# Patient Record
Sex: Female | Born: 1985 | Race: Black or African American | Hispanic: No | Marital: Single | Smoking: Never smoker
Health system: Southern US, Community
[De-identification: ages and names within clinical notes are randomized; demographics above are authoritative.]

## PROBLEM LIST (undated history)

## (undated) ENCOUNTER — Inpatient Hospital Stay (HOSPITAL_COMMUNITY): Payer: Self-pay

## (undated) DIAGNOSIS — Z8709 Personal history of other diseases of the respiratory system: Secondary | ICD-10-CM

## (undated) DIAGNOSIS — T4145XA Adverse effect of unspecified anesthetic, initial encounter: Secondary | ICD-10-CM

## (undated) DIAGNOSIS — R7303 Prediabetes: Secondary | ICD-10-CM

## (undated) DIAGNOSIS — D259 Leiomyoma of uterus, unspecified: Secondary | ICD-10-CM

## (undated) DIAGNOSIS — J45909 Unspecified asthma, uncomplicated: Secondary | ICD-10-CM

## (undated) DIAGNOSIS — G43829 Menstrual migraine, not intractable, without status migrainosus: Secondary | ICD-10-CM

## (undated) DIAGNOSIS — R112 Nausea with vomiting, unspecified: Secondary | ICD-10-CM

## (undated) DIAGNOSIS — D573 Sickle-cell trait: Secondary | ICD-10-CM

## (undated) DIAGNOSIS — D649 Anemia, unspecified: Secondary | ICD-10-CM

## (undated) DIAGNOSIS — M1990 Unspecified osteoarthritis, unspecified site: Secondary | ICD-10-CM

## (undated) DIAGNOSIS — K5909 Other constipation: Secondary | ICD-10-CM

## (undated) DIAGNOSIS — R000 Tachycardia, unspecified: Secondary | ICD-10-CM

## (undated) DIAGNOSIS — Z789 Other specified health status: Secondary | ICD-10-CM

## (undated) DIAGNOSIS — T8859XA Other complications of anesthesia, initial encounter: Secondary | ICD-10-CM

## (undated) DIAGNOSIS — Z9889 Other specified postprocedural states: Secondary | ICD-10-CM

## (undated) HISTORY — PX: ECTOPIC PREGNANCY SURGERY: SHX613

## (undated) HISTORY — DX: Other complications of anesthesia, initial encounter: T88.59XA

## (undated) HISTORY — DX: Adverse effect of unspecified anesthetic, initial encounter: T41.45XA

## (undated) HISTORY — PX: TONSILLECTOMY: SUR1361

## (undated) HISTORY — DX: Nausea with vomiting, unspecified: R11.2

## (undated) HISTORY — DX: Other specified postprocedural states: Z98.890

## (undated) HISTORY — PX: DILATION AND CURETTAGE OF UTERUS: SHX78

---
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
## 2007-10-30 HISTORY — PX: TONSILLECTOMY: SUR1361

## 2008-06-15 ENCOUNTER — Emergency Department (HOSPITAL_COMMUNITY): Admission: EM | Admit: 2008-06-15 | Discharge: 2008-06-15 | Payer: Self-pay | Admitting: Emergency Medicine

## 2008-08-12 ENCOUNTER — Encounter (INDEPENDENT_AMBULATORY_CARE_PROVIDER_SITE_OTHER): Payer: Self-pay | Admitting: Otolaryngology

## 2008-08-12 ENCOUNTER — Ambulatory Visit (HOSPITAL_BASED_OUTPATIENT_CLINIC_OR_DEPARTMENT_OTHER): Admission: RE | Admit: 2008-08-12 | Discharge: 2008-08-12 | Payer: Self-pay | Admitting: Otolaryngology

## 2010-04-06 ENCOUNTER — Emergency Department (HOSPITAL_COMMUNITY): Admission: EM | Admit: 2010-04-06 | Discharge: 2010-04-06 | Payer: Self-pay | Admitting: Family Medicine

## 2010-08-10 ENCOUNTER — Other Ambulatory Visit: Admission: RE | Admit: 2010-08-10 | Discharge: 2010-08-10 | Payer: Self-pay | Admitting: Family Medicine

## 2010-10-29 HISTORY — PX: ECTOPIC PREGNANCY SURGERY: SHX613

## 2010-12-04 ENCOUNTER — Emergency Department (HOSPITAL_COMMUNITY)
Admission: EM | Admit: 2010-12-04 | Discharge: 2010-12-05 | Disposition: A | Discharge status: Other Acute Inpatient Hospital | Payer: PRIVATE HEALTH INSURANCE | Attending: Emergency Medicine | Admitting: Emergency Medicine

## 2010-12-04 DIAGNOSIS — O009 Unspecified ectopic pregnancy without intrauterine pregnancy: Secondary | ICD-10-CM | POA: Insufficient documentation

## 2010-12-04 DIAGNOSIS — E669 Obesity, unspecified: Secondary | ICD-10-CM | POA: Insufficient documentation

## 2010-12-04 DIAGNOSIS — J45909 Unspecified asthma, uncomplicated: Secondary | ICD-10-CM | POA: Insufficient documentation

## 2010-12-04 DIAGNOSIS — R1032 Left lower quadrant pain: Secondary | ICD-10-CM | POA: Insufficient documentation

## 2010-12-04 DIAGNOSIS — N898 Other specified noninflammatory disorders of vagina: Secondary | ICD-10-CM | POA: Insufficient documentation

## 2010-12-04 LAB — CBC
HCT: 38 % (ref 36.0–46.0)
Hemoglobin: 12.8 g/dL (ref 12.0–15.0)
MCH: 24.5 pg — ABNORMAL LOW (ref 26.0–34.0)
MCHC: 33.7 g/dL (ref 30.0–36.0)
MCV: 72.7 fL — ABNORMAL LOW (ref 78.0–100.0)
Platelets: 290 K/uL (ref 150–400)
RBC: 5.23 MIL/uL — ABNORMAL HIGH (ref 3.87–5.11)
RDW: 14.8 % (ref 11.5–15.5)
WBC: 6.9 K/uL (ref 4.0–10.5)

## 2010-12-04 LAB — DIFFERENTIAL
Basophils Absolute: 0 K/uL (ref 0.0–0.1)
Basophils Relative: 1 % (ref 0–1)
Eosinophils Absolute: 0.1 K/uL (ref 0.0–0.7)
Eosinophils Relative: 2 % (ref 0–5)
Lymphocytes Relative: 16 % (ref 12–46)
Lymphs Abs: 1.1 K/uL (ref 0.7–4.0)
Monocytes Absolute: 0.6 K/uL (ref 0.1–1.0)
Monocytes Relative: 8 % (ref 3–12)
Neutro Abs: 5 K/uL (ref 1.7–7.7)
Neutrophils Relative %: 73 % (ref 43–77)

## 2010-12-04 LAB — URINE MICROSCOPIC-ADD ON

## 2010-12-04 LAB — URINALYSIS, ROUTINE W REFLEX MICROSCOPIC
Bilirubin Urine: NEGATIVE
Ketones, ur: NEGATIVE mg/dL
Leukocytes, UA: NEGATIVE
Nitrite: NEGATIVE
Protein, ur: NEGATIVE mg/dL
Specific Gravity, Urine: 1.021 (ref 1.005–1.030)
Urine Glucose, Fasting: NEGATIVE mg/dL
Urobilinogen, UA: 0.2 mg/dL (ref 0.0–1.0)
pH: 5.5 (ref 5.0–8.0)

## 2010-12-04 LAB — POCT PREGNANCY, URINE: Preg Test, Ur: POSITIVE

## 2010-12-05 ENCOUNTER — Emergency Department (HOSPITAL_COMMUNITY): Payer: PRIVATE HEALTH INSURANCE

## 2010-12-05 ENCOUNTER — Ambulatory Visit (HOSPITAL_COMMUNITY)
Admission: AD | Admit: 2010-12-05 | Discharge: 2010-12-05 | Disposition: A | Discharge status: Home / Self Care | Payer: PRIVATE HEALTH INSURANCE | Source: Ambulatory Visit | Attending: Obstetrics and Gynecology | Admitting: Obstetrics and Gynecology

## 2010-12-05 ENCOUNTER — Other Ambulatory Visit: Payer: Self-pay | Admitting: Obstetrics and Gynecology

## 2010-12-05 DIAGNOSIS — O00109 Unspecified tubal pregnancy without intrauterine pregnancy: Secondary | ICD-10-CM | POA: Insufficient documentation

## 2010-12-05 LAB — WET PREP, GENITAL

## 2010-12-05 LAB — COMPREHENSIVE METABOLIC PANEL
Albumin: 3.6 g/dL (ref 3.5–5.2)
Alkaline Phosphatase: 55 U/L (ref 39–117)
BUN: 7 mg/dL (ref 6–23)
CO2: 27 mEq/L (ref 19–32)
Chloride: 102 mEq/L (ref 96–112)
Glucose, Bld: 131 mg/dL — ABNORMAL HIGH (ref 70–99)
Potassium: 3.8 mEq/L (ref 3.5–5.1)
Total Bilirubin: 0.2 mg/dL — ABNORMAL LOW (ref 0.3–1.2)

## 2010-12-05 LAB — LIPASE, BLOOD: Lipase: 26 U/L (ref 11–59)

## 2010-12-05 LAB — ABO/RH: ABO/RH(D): O POS

## 2010-12-05 LAB — GC/CHLAMYDIA PROBE AMP, GENITAL: GC Probe Amp, Genital: NEGATIVE

## 2010-12-05 LAB — SAMPLE TO BLOOD BANK

## 2010-12-13 NOTE — Op Note (Signed)
NAMESHAINA, Hayley Chambers                 ACCOUNT NO.:  [id number]  MEDICAL RECORD NO.:  [id number]           PATIENT TYPE:  O  LOCATION:  [facility]                          [facility]  [facility]  PHYSICIAN:  Cynthia P. Romine, M.D.DATE OF BIRTH:  Sep 03, 1986  DATE OF PROCEDURE:  12/05/2010 DATE OF DISCHARGE:                              OPERATIVE REPORT   PREOPERATIVE DIAGNOSIS:  Left ectopic pregnancy.  POSTOPERATIVE DIAGNOSIS:  Left ectopic pregnancy.  PATHOLOGY:  Pending.  PROCEDURE:  Laparoscopic partial left salpingectomy for ectopic pregnancy.  SURGEON:  Cynthia P. Romine, M.D.  ANESTHESIA:  General endotracheal.  ESTIMATED BLOOD LOSS:  Minimal.  COMPLICATIONS:  None.  PROCEDURE:  The patient was taken to the operating room and after induction of adequate general endotracheal anesthesia was placed in the dorsal lithotomy position and prepped and draped in the usual fashion. The bladder was drained with a red rubber catheter.  A Hulka uterine manipulator was placed.  Attention was next turned to the abdomen.  An infraumbilical area was anesthetized with quarter percent Marcaine and then incised with a knife, approximately 1.5 cm, and a Veress needle was inserted into peritoneal space.  Proper placement was tested by noting a negative aspirate and free flow of saline through the Veress needle, again with a negative aspirate and then by noting the response of a drop of saline placed at the hub of the Veress needle as the abdominal wall was elevated.  Pneumoperitoneum was then created with 2 liters of CO2 using the automatic insufflator.  A disposable 10/11 mm trocar was then placed into peritoneal space and its proper placement noted with the laparoscope.  The abdominal wall was transilluminated, and two areas of each lower quadrant were anesthetized with 0.25% Marcaine and 5-mm incisions were made and the 5-mm disposable trocars were placed under direct visualization.  The patient was  placed in steep Trendelenburg. The uterus was elevated, and it was felt to be normal size, shape, and contour.  On the left, the tube was freely mobile.  The fimbria was fimbriae were luxuriant.  There was a small paratubal cyst.  The ovary appeared normal that was on the right.  On the left, there was a normal ovary and mobile tube but it was dilated at its distal end along the antimesenteric border with a relatively large ectopic pregnancy that was bilobed, photographic documentation was taken, million dollar grasper was then placed to elevate the tube and the gyrus was used to come across the mesosalpinx and the ectopic pregnancy just proximal to where the ectopic was and the distal segment of the tube was excised.  There was no bleeding encountered.  There was a small amount of blood in the pelvis may be 10 mL.  A grasper teeth was put in through the operative microscope and the specimen was removed through the umbilical trocar sleeve.  It was sent to pathology.  The pelvis was inspected and irrigated.  It was hemostatic.  Photographic documentation was taken of the sleeve, and the scope were removed.  Pneumoperitoneum was allowed to escape through the umbilical trocar sleeve and then it  was removed.  The fascia was grasped with Kochers and a single suture of 0 Vicryl was placed in the fascia at the umbilicus.  The skin was closed with 4-0 Vicryl Rapide and Dermabond. The manipulator removed from the vagina, and the procedure was terminated.  The patient tolerated it well and left in satisfactory condition to post anesthesia recovery.     Cynthia P. Romine, M.D.     CPR/MEDQ  D:  12/05/2010  T:  12/06/2010  Job:  [id number]  Electronically Signed by Meredeth Ide M.D. on 12/13/2010 10:38:06 AM

## 2011-03-13 NOTE — Op Note (Signed)
Hayley Chambers, Hayley Chambers                 ACCOUNT NO.:  [id number]   MEDICAL RECORD NO.:  [id number]          PATIENT TYPE:  AMB   LOCATION:  [facility]                          FACILITY:  [facility]   PHYSICIAN:  Suzanna Obey, M.D.       DATE OF BIRTH:  [birth year]   DATE OF PROCEDURE:  08/12/2008  DATE OF DISCHARGE:                               OPERATIVE REPORT   PREOPERATIVE DIAGNOSIS:  Chronic tonsillitis.   POSTOPERATIVE DIAGNOSIS:  Chronic tonsillitis.   SURGICAL PROCEDURE:  Tonsillectomy.   ANESTHESIA:  General.   ESTIMATED BLOOD LOSS:  Less than 5 mL.   INDICATIONS:  This is a 25 year old with recurrent problems with  tonsillitis, and have been refractory to medical therapy.  The patient  was informed of the risks and benefits of the procedure, and options  were discussed.  All questions were answered and consent was obtained.   OPERATION:  The patient was taken to the operating room, placed in  supine position after adequate general endotracheal tube was placed in  the Prosed position, and draped in the usual sterile manner.  A Crowe-  Davis mouth gag was inserted, retracted, and suspended from [facility] stand.  Left tonsil, begun making a left anterior tonsillar pillar incision,  identifying the capsule of tonsil and removing it with an electrocautery  dissection. Right tonsil removed in the same fashion.  Adenoids were  checked.  There was no significant adenoid tissue present.  There was a  good hemostasis.  Hypopharynx, esophagus, and stomach were suctioned  with the NG tube.  Crowe-Davis was released and resuspended.  There was  hemostasis present in all locations.  The patient was then off the CroweEarlene Plater, awakened, and brought to the recovery room in stable condition.  Counts correct.           ______________________________  Suzanna Obey, M.D.     JB/MEDQ  D:  08/12/2008  T:  08/12/2008  Job:  [id number]

## 2012-10-29 HISTORY — PX: DILATION AND CURETTAGE OF UTERUS: SHX78

## 2013-01-13 ENCOUNTER — Emergency Department (HOSPITAL_COMMUNITY)
Admission: EM | Admit: 2013-01-13 | Discharge: 2013-01-13 | Disposition: A | Discharge status: Home / Self Care | Payer: BC Managed Care – PPO | Source: Home / Self Care | Attending: Family Medicine | Admitting: Family Medicine

## 2013-01-13 ENCOUNTER — Encounter (HOSPITAL_COMMUNITY): Payer: Self-pay

## 2013-01-13 DIAGNOSIS — J111 Influenza due to unidentified influenza virus with other respiratory manifestations: Secondary | ICD-10-CM

## 2013-01-13 MED ORDER — ACETAMINOPHEN 500 MG PO TABS
1000.0000 mg | ORAL_TABLET | Freq: Once | ORAL | Status: AC
  Administered 2013-01-13: 1000 mg via ORAL

## 2013-01-13 MED ORDER — ACETAMINOPHEN 325 MG PO TABS
ORAL_TABLET | ORAL | Status: AC
  Filled 2013-01-13: qty 3

## 2013-01-13 NOTE — ED Notes (Signed)
Felt weak, dizzy since last PM, sister w pt states she nearly fainted whole heating soup and had to sit down

## 2013-01-13 NOTE — ED Provider Notes (Signed)
History     CSN: [id number]  Arrival date & time 01/13/13  1552   First MD Initiated Contact with Patient 01/13/13 1603      Chief Complaint  Patient presents with  . Influenza    (Consider location/radiation/quality/duration/timing/severity/associated sxs/prior treatment) Patient is a 27 y.o. female presenting with flu symptoms. The history is provided by the patient and a relative.  Influenza Presenting symptoms: fever, myalgias and sore throat   Presenting symptoms: no nausea, no rhinorrhea, no shortness of breath and no vomiting   Severity:  Moderate Onset quality:  Sudden Duration:  1 day Progression:  Worsening Chronicity:  New Relieved by:  Nothing Associated symptoms: chills   Associated symptoms: no congestion   Risk factors comment:  Took flu vaccine in oct 2013.   History reviewed. No pertinent past medical history.  Past Surgical History  Procedure Laterality Date  . Tonsillectomy    . Ectopic pregnancy surgery      History reviewed. No pertinent family history.  History  Substance Use Topics  . Smoking status: Never Smoker   . Smokeless tobacco: Not on file  . Alcohol Use: Yes    OB History   Grav Para Term Preterm Abortions TAB SAB Ect Mult Living                  Review of Systems  Constitutional: Positive for fever and chills.  HENT: Positive for sore throat. Negative for congestion and rhinorrhea.   Respiratory: Negative for shortness of breath.   Gastrointestinal: Negative for nausea, vomiting and abdominal pain.  Musculoskeletal: Positive for myalgias. Negative for back pain.  Skin: Negative.     Allergies  Review of patient's allergies indicates no known allergies.  Home Medications  No current outpatient prescriptions on file.  BP 106/62  Pulse 114  Temp(Src) 102.5 F (39.2 C) (Oral)  Resp 24  SpO2 100%  LMP 01/03/2013  Physical Exam  Nursing note and vitals reviewed. Constitutional: She is oriented to person, place,  and time. She appears well-developed and well-nourished. No distress.  HENT:  Head: Normocephalic.  Right Ear: External ear normal.  Left Ear: External ear normal.  Mouth/Throat: Oropharynx is clear and moist.  Eyes: Pupils are equal, round, and reactive to light.  Neck: Normal range of motion. Neck supple.  Cardiovascular: Normal rate, regular rhythm, normal heart sounds and intact distal pulses.   Pulmonary/Chest: Effort normal and breath sounds normal.  Abdominal: Soft. Bowel sounds are normal. There is no tenderness.  Lymphadenopathy:    She has no cervical adenopathy.  Neurological: She is alert and oriented to person, place, and time.  Skin: Skin is warm and dry.    ED Course  Procedures (including critical care time)  Labs Reviewed - No data to display No results found.   1. Influenza       MDM          Linna Hoff, MD 01/13/13 [phone]

## 2013-04-28 ENCOUNTER — Other Ambulatory Visit (HOSPITAL_COMMUNITY)
Admission: RE | Admit: 2013-04-28 | Discharge: 2013-04-28 | Disposition: A | Discharge status: Home / Self Care | Payer: BC Managed Care – PPO | Source: Ambulatory Visit | Attending: Obstetrics and Gynecology | Admitting: Obstetrics and Gynecology

## 2013-04-28 ENCOUNTER — Other Ambulatory Visit: Payer: Self-pay | Admitting: Nurse Practitioner

## 2013-04-28 DIAGNOSIS — Z01419 Encounter for gynecological examination (general) (routine) without abnormal findings: Secondary | ICD-10-CM | POA: Insufficient documentation

## 2013-08-05 ENCOUNTER — Encounter (HOSPITAL_COMMUNITY): Payer: Self-pay | Admitting: Emergency Medicine

## 2013-08-05 ENCOUNTER — Emergency Department (INDEPENDENT_AMBULATORY_CARE_PROVIDER_SITE_OTHER)
Admission: EM | Admit: 2013-08-05 | Discharge: 2013-08-05 | Disposition: A | Discharge status: Home / Self Care | Payer: BC Managed Care – PPO | Source: Home / Self Care | Attending: Family Medicine | Admitting: Family Medicine

## 2013-08-05 DIAGNOSIS — M25569 Pain in unspecified knee: Secondary | ICD-10-CM

## 2013-08-05 DIAGNOSIS — M25562 Pain in left knee: Secondary | ICD-10-CM

## 2013-08-05 DIAGNOSIS — M545 Low back pain: Secondary | ICD-10-CM

## 2013-08-05 MED ORDER — MELOXICAM 15 MG PO TABS: 15.0000 mg | ORAL_TABLET | Freq: Every day | ORAL | Status: DC | PRN
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
## 2014-01-05 ENCOUNTER — Other Ambulatory Visit (HOSPITAL_COMMUNITY)
Admission: RE | Admit: 2014-01-05 | Discharge: 2014-01-05 | Disposition: A | Discharge status: Home / Self Care | Payer: 59 | Source: Ambulatory Visit | Attending: Family Medicine | Admitting: Family Medicine

## 2014-01-05 ENCOUNTER — Emergency Department (HOSPITAL_COMMUNITY)
Admission: EM | Admit: 2014-01-05 | Discharge: 2014-01-05 | Disposition: A | Discharge status: Home / Self Care | Payer: 59 | Source: Home / Self Care | Attending: Family Medicine | Admitting: Family Medicine

## 2014-01-05 DIAGNOSIS — N760 Acute vaginitis: Secondary | ICD-10-CM

## 2014-01-05 DIAGNOSIS — N390 Urinary tract infection, site not specified: Secondary | ICD-10-CM

## 2014-01-05 DIAGNOSIS — Z113 Encounter for screening for infections with a predominantly sexual mode of transmission: Secondary | ICD-10-CM | POA: Insufficient documentation

## 2014-01-05 LAB — POCT PREGNANCY, URINE: PREG TEST UR: NEGATIVE

## 2014-01-05 LAB — POCT URINALYSIS DIP (DEVICE)
BILIRUBIN URINE: NEGATIVE
Glucose, UA: NEGATIVE mg/dL
KETONES UR: NEGATIVE mg/dL
Nitrite: NEGATIVE
PH: 6 (ref 5.0–8.0)
PROTEIN: 30 mg/dL — AB
SPECIFIC GRAVITY, URINE: 1.02 (ref 1.005–1.030)
Urobilinogen, UA: 0.2 mg/dL (ref 0.0–1.0)

## 2014-01-05 MED ORDER — CEFUROXIME AXETIL 250 MG PO TABS: 250.0000 mg | ORAL_TABLET | Freq: Two times a day (BID) | ORAL | Status: DC

## 2014-01-05 MED ORDER — CEFIXIME 400 MG PO TABS
400.0000 mg | ORAL_TABLET | Freq: Once | ORAL | Status: AC
  Administered 2014-01-05: 400 mg via ORAL

## 2014-01-05 MED ORDER — CEFIXIME 400 MG PO TABS
ORAL_TABLET | ORAL | Status: AC
  Filled 2014-01-05: qty 1

## 2014-01-05 MED ORDER — FLUCONAZOLE 150 MG PO TABS: 150.0000 mg | ORAL_TABLET | Freq: Once | ORAL | Status: DC

## 2014-01-05 NOTE — ED Provider Notes (Signed)
CSN: [id number]     Arrival date & time 01/05/14  1841 History   First MD Initiated Contact with Patient 01/05/14 1928     Chief Complaint  Patient presents with  . Urinary Tract Infection  . Vaginal Discharge   (Consider location/radiation/quality/duration/timing/severity/associated sxs/prior Treatment) HPI Comments: 28 year old female presents complaining of possible urinary tract infection. Starting a few hours ago, she began to have lower abdominal pain and pressure as well as burning pain in her urethra after urination. She also has urinary frequency and urgency. She has had a urinary tract infection before that felt very similar to this at the beginning. About a week ago she had some vaginal discharge that was malodorous, she would like to have this evaluated as well. Denies fever, chills, NVD, flank pain. Denies any risk factors for STDs  Patient is a 28 y.o. female presenting with urinary tract infection and vaginal discharge.  Urinary Tract Infection Pertinent negatives include no chest pain, no abdominal pain and no shortness of breath.  Vaginal Discharge Associated symptoms: dysuria   Associated symptoms: no abdominal pain, no fever, no nausea and no vomiting     No past medical history on file. Past Surgical History  Procedure Laterality Date  . Tonsillectomy    . Ectopic pregnancy surgery     No family history on file. History  Substance Use Topics  . Smoking status: Never Smoker   . Smokeless tobacco: Not on file  . Alcohol Use: Yes   OB History   Grav Para Term Preterm Abortions TAB SAB Ect Mult Living                 Review of Systems  Constitutional: Negative for fever and chills.  Eyes: Negative for visual disturbance.  Respiratory: Negative for cough and shortness of breath.   Cardiovascular: Negative for chest pain, palpitations and leg swelling.  Gastrointestinal: Negative for nausea, vomiting and abdominal pain.  Endocrine: Negative for polydipsia and  polyuria.  Genitourinary: Positive for dysuria, urgency, frequency and vaginal discharge. Negative for hematuria, decreased urine volume, vaginal bleeding, genital sores, vaginal pain and pelvic pain.  Musculoskeletal: Negative for arthralgias and myalgias.  Skin: Negative for rash.  Neurological: Negative for dizziness, weakness and light-headedness.    Allergies  Review of patient's allergies indicates no known allergies.  Home Medications   Current Outpatient Rx  Name  Route  Sig  Dispense  Refill  . cefUROXime (CEFTIN) 250 MG tablet   Oral   Take 1 tablet (250 mg total) by mouth 2 (two) times daily with a meal.   10 tablet   0   . fluconazole (DIFLUCAN) 150 MG tablet   Oral   Take 1 tablet (150 mg total) by mouth once. Pick up the refill and and take second dose in 5 days if symptoms have not resolved   1 tablet   1   . meloxicam (MOBIC) 15 MG tablet   Oral   Take 1 tablet (15 mg total) by mouth daily as needed for pain.   30 tablet   0    LMP 12/16/2013 Physical Exam  Nursing note and vitals reviewed. Constitutional: She is oriented to person, place, and time. Vital signs are normal. She appears well-developed and well-nourished. No distress.  HENT:  Head: Normocephalic and atraumatic.  Pulmonary/Chest: Effort normal. No respiratory distress.  Abdominal: Soft. Bowel sounds are normal. She exhibits no distension and no mass. There is tenderness (suprapubic). There is no rebound and no guarding.  Genitourinary: Uterus normal. There is no lesion on the right labia. Cervix exhibits no discharge and no friability. No erythema, tenderness or bleeding around the vagina. Vaginal discharge (Minimal) found.  Lymphadenopathy:       Right: No inguinal adenopathy present.       Left: No inguinal adenopathy present.  Neurological: She is alert and oriented to person, place, and time. She has normal strength. Coordination normal.  Skin: Skin is warm and dry. No rash noted. She is  not diaphoretic.  Psychiatric: She has a normal mood and affect. Judgment normal.    ED Course  Procedures (including critical care time) Labs Review Labs Reviewed  POCT URINALYSIS DIP (DEVICE) - Abnormal; Notable for the following:    Hgb urine dipstick MODERATE (*)    Protein, ur 30 (*)    Leukocytes, UA LARGE (*)    All other components within normal limits  URINE CULTURE  POCT PREGNANCY, URINE  CERVICOVAGINAL ANCILLARY ONLY   Imaging Review No results found.   MDM   1. UTI (lower urinary tract infection)   2. Vaginitis    Giving a dose of Suprax at night so she can go ahead and start antibiotics and she will start Ceftin in the morning. She says she always gets a yeast infection when she takes antibiotics, will prescribe Diflucan as well. Followup when necessary if not improving   Meds ordered this encounter  Medications  . cefixime (SUPRAX) tablet 400 mg    Sig:   . cefUROXime (CEFTIN) 250 MG tablet    Sig: Take 1 tablet (250 mg total) by mouth 2 (two) times daily with a meal.    Dispense:  10 tablet    Refill:  0    Order Specific Question:  Supervising Provider    Answer:  Lynne Leader, [place]  . fluconazole (DIFLUCAN) 150 MG tablet    Sig: Take 1 tablet (150 mg total) by mouth once. Pick up the refill and and take second dose in 5 days if symptoms have not resolved    Dispense:  1 tablet    Refill:  1    Order Specific Question:  Supervising Provider    Answer:  Lynne Leader, [place]       Liam Graham, PA-C 01/05/14 2000

## 2014-01-05 NOTE — Discharge Instructions (Signed)
Urinary Tract Infection  Urinary tract infections (UTIs) can develop anywhere along your urinary tract. Your urinary tract is your body's drainage system for removing wastes and extra water. Your urinary tract includes two kidneys, two ureters, a bladder, and a urethra. Your kidneys are a pair of bean-shaped organs. Each kidney is about the size of your fist. They are located below your ribs, one on each side of your spine.  CAUSES  Infections are caused by microbes, which are microscopic organisms, including fungi, viruses, and bacteria. These organisms are so small that they can only be seen through a microscope. Bacteria are the microbes that most commonly cause UTIs.  SYMPTOMS   Symptoms of UTIs may vary by age and gender of the patient and by the location of the infection. Symptoms in young women typically include a frequent and intense urge to urinate and a painful, burning feeling in the bladder or urethra during urination. Older women and men are more likely to be tired, shaky, and weak and have muscle aches and abdominal pain. A fever may mean the infection is in your kidneys. Other symptoms of a kidney infection include pain in your back or sides below the ribs, nausea, and vomiting.  DIAGNOSIS  To diagnose a UTI, your caregiver will ask you about your symptoms. Your caregiver also will ask to provide a urine sample. The urine sample will be tested for bacteria and white blood cells. White blood cells are made by your body to help fight infection.  TREATMENT   Typically, UTIs can be treated with medication. Because most UTIs are caused by a bacterial infection, they usually can be treated with the use of antibiotics. The choice of antibiotic and length of treatment depend on your symptoms and the type of bacteria causing your infection.  HOME CARE INSTRUCTIONS   If you were prescribed antibiotics, take them exactly as your caregiver instructs you. Finish the medication even if you feel better after you  have only taken some of the medication.   Drink enough water and fluids to keep your urine clear or pale yellow.   Avoid caffeine, tea, and carbonated beverages. They tend to irritate your bladder.   Empty your bladder often. Avoid holding urine for long periods of time.   Empty your bladder before and after sexual intercourse.   After a bowel movement, women should cleanse from front to back. Use each tissue only once.  SEEK MEDICAL CARE IF:    You have back pain.   You develop a fever.   Your symptoms do not begin to resolve within 3 days.  SEEK IMMEDIATE MEDICAL CARE IF:    You have severe back pain or lower abdominal pain.   You develop chills.   You have nausea or vomiting.   You have continued burning or discomfort with urination.  MAKE SURE YOU:    Understand these instructions.   Will watch your condition.   Will get help right away if you are not doing well or get worse.  Document Released: 07/25/2005 Document Revised: 04/15/2012 Document Reviewed: 11/23/2011  [employer] Patient Information 2014 [employer]

## 2014-01-05 NOTE — ED Notes (Signed)
C/o  Urinary urgency and frequency.  Pain after urinating.  Lower pelvic pain.  On set today.   Also c/o  Vaginal discharge with odor.    No otc meds taken for symptoms.

## 2014-01-06 LAB — CERVICOVAGINAL ANCILLARY ONLY
Chlamydia: NEGATIVE
Neisseria Gonorrhea: NEGATIVE
WET PREP (BD AFFIRM): NEGATIVE
WET PREP (BD AFFIRM): NEGATIVE
Wet Prep (BD Affirm): NEGATIVE

## 2014-01-06 NOTE — ED Provider Notes (Signed)
Medical screening examination/treatment/procedure(s) were performed by a resident physician or non-physician practitioner and as the supervising physician I was immediately available for consultation/collaboration.  Natividad Schlosser, MD    Child Campoy S Aviana Shevlin, MD 01/06/14 0803 

## 2014-01-07 LAB — URINE CULTURE

## 2014-01-07 NOTE — ED Notes (Addendum)
GC/Chlamydia neg., Affirm: Candida, Gardnerella and Trich neg., Urine culture:: >100,000 colonies E. Coli.  Pt. adequately treated with Ceftin. Reviewed by Dr. Georgina Snell. Roselyn Meier 01/07/2014

## 2014-02-20 ENCOUNTER — Emergency Department: Payer: Self-pay | Admitting: Internal Medicine

## 2015-03-28 ENCOUNTER — Emergency Department (HOSPITAL_COMMUNITY)
Admission: EM | Admit: 2015-03-28 | Discharge: 2015-03-28 | Disposition: A | Discharge status: Home / Self Care | Payer: PRIVATE HEALTH INSURANCE | Attending: Emergency Medicine | Admitting: Emergency Medicine

## 2015-03-28 ENCOUNTER — Emergency Department (HOSPITAL_COMMUNITY): Payer: PRIVATE HEALTH INSURANCE

## 2015-03-28 ENCOUNTER — Encounter (HOSPITAL_COMMUNITY): Payer: Self-pay | Admitting: Emergency Medicine

## 2015-03-28 DIAGNOSIS — Z79899 Other long term (current) drug therapy: Secondary | ICD-10-CM | POA: Diagnosis not present

## 2015-03-28 DIAGNOSIS — R079 Chest pain, unspecified: Secondary | ICD-10-CM | POA: Diagnosis present

## 2015-03-28 DIAGNOSIS — R0789 Other chest pain: Secondary | ICD-10-CM | POA: Diagnosis not present

## 2015-03-28 LAB — BASIC METABOLIC PANEL
ANION GAP: 7 (ref 5–15)
BUN: 10 mg/dL (ref 6–20)
CO2: 27 mmol/L (ref 22–32)
Calcium: 9.3 mg/dL (ref 8.9–10.3)
Chloride: 106 mmol/L (ref 101–111)
Creatinine, Ser: 0.76 mg/dL (ref 0.44–1.00)
GFR calc Af Amer: >60 mL/min (ref >60)
GFR calc non Af Amer: >60 mL/min (ref >60)
GLUCOSE: 75 mg/dL (ref 65–99)
Potassium: 3.6 mmol/L (ref 3.5–5.1)
Sodium: 140 mmol/L (ref 135–145)

## 2015-03-28 LAB — CBC
HCT: 42.3 % (ref 36.0–46.0)
Hemoglobin: 13.9 g/dL (ref 12.0–15.0)
MCH: 23.9 pg — ABNORMAL LOW (ref 26.0–34.0)
MCHC: 32.9 g/dL (ref 30.0–36.0)
MCV: 72.8 fL — ABNORMAL LOW (ref 78.0–100.0)
PLATELETS: 293 10*3/uL (ref 150–400)
RBC: 5.81 MIL/uL — ABNORMAL HIGH (ref 3.87–5.11)
RDW: 14.4 % (ref 11.5–15.5)
WBC: 4.8 10*3/uL (ref 4.0–10.5)

## 2015-03-28 LAB — I-STAT TROPONIN, ED: Troponin i, poc: 0 ng/mL (ref 0.00–0.08)

## 2015-03-28 MED ORDER — NAPROXEN 500 MG PO TABS
500.0000 mg | ORAL_TABLET | Freq: Once | ORAL | Status: AC
  Administered 2015-03-28: 500 mg via ORAL
  Filled 2015-03-28: qty 1

## 2015-03-28 MED ORDER — KETOROLAC TROMETHAMINE 60 MG/2ML IM SOLN
30.0000 mg | Freq: Once | INTRAMUSCULAR | Status: AC
  Administered 2015-03-28: 30 mg via INTRAMUSCULAR
  Filled 2015-03-28: qty 2

## 2015-03-28 MED ORDER — NAPROXEN 250 MG PO TABS: 250.0000 mg | ORAL_TABLET | Freq: Two times a day (BID) | ORAL | Status: DC

## 2015-03-28 NOTE — ED Notes (Signed)
Pt reports left chest pain (underneath left breast) radiating to the back starting yesterday. Says, "I thought it was gas so I took Tums but it didn't go away. I took 2 Tums today." Denies SOB/N/V/D/F/dizziness/lightheadedness. Denies sickness recently. Denies any injury/trauma to ribs recently. Was recently on NuvaRing-stopped last month. Denies any other symptoms/changes.

## 2015-03-28 NOTE — ED Notes (Signed)
Pt is eating and drinking chik fil a that boyfriend brought to hospital for her

## 2015-03-28 NOTE — ED Provider Notes (Signed)
CSN: [id number]     Arrival date & time 03/28/15  1338 History   First MD Initiated Contact with Patient 03/28/15 1522     Chief Complaint  Patient presents with  . Chest Pain   Hayley Chambers is a 29 y.o. female who is otherwise healthy who presents to the ED complaining of gradual onset of left sided chest pain starting last night. She reports it has gradually worsened since waking up this morning. She is unable to elicit in aggravating or alleviating factors. She complains of 6 out of 10 left-sided chest pain that is somewhat back and she describes as aching and sharp. She has worsening pain with breathing or palpitations. She denies shortness of breath or cough. She denies any injury or trauma to her chest. She denies any more exercise. Reports taking Tums for her symptoms relief. She denies any acid reflux, burping or belching. She denies smoking, recent surgery or recent long travel. She reports he stopped using her NuvaRing one month ago. She denies any personal or close family history of MI, PE or DVT. She reports a grandfather with a history of a MI and a grandmother with a history of a DVT. She denies any personal or family history of blood clotting disorders such as factor V leiden, protein C or S deficiency. Patient denies fevers, chills, abdominal pain, nausea, vomiting, palpitations, shortness breath, coughing, hemoptysis, wheezing, belching, or urinary symptoms.  (Consider location/radiation/quality/duration/timing/severity/associated sxs/prior Treatment) HPI  History reviewed. No pertinent past medical history. Past Surgical History  Procedure Laterality Date  . Tonsillectomy    . Ectopic pregnancy surgery     History reviewed. No pertinent family history. History  Substance Use Topics  . Smoking status: Never Smoker   . Smokeless tobacco: Not on file  . Alcohol Use: Yes   OB History    No data available     Review of Systems  Constitutional: Negative for fever and  chills.  HENT: Negative for congestion, ear pain and sore throat.   Eyes: Negative for pain and visual disturbance.  Respiratory: Negative for cough, chest tightness, shortness of breath and wheezing.   Cardiovascular: Positive for chest pain. Negative for palpitations and leg swelling.  Gastrointestinal: Negative for nausea, vomiting, abdominal pain and diarrhea.  Genitourinary: Negative for dysuria, urgency, frequency and difficulty urinating.  Musculoskeletal: Negative for back pain and neck pain.  Skin: Negative for rash.  Neurological: Negative for dizziness, light-headedness and headaches.      Allergies  Review of patient's allergies indicates no known allergies.  Home Medications   Prior to Admission medications   Medication Sig Start Date End Date Taking? Authorizing Provider  ibuprofen (ADVIL,MOTRIN) 200 MG tablet Take 600 mg by mouth every 6 (six) hours as needed for headache.   Yes Historical Provider, MD  cefUROXime (CEFTIN) 250 MG tablet Take 1 tablet (250 mg total) by mouth 2 (two) times daily with a meal. Patient not taking: Reported on 03/28/2015 01/05/14   Liam Graham, PA-C  fluconazole (DIFLUCAN) 150 MG tablet Take 1 tablet (150 mg total) by mouth once. Pick up the refill and and take second dose in 5 days if symptoms have not resolved Patient not taking: Reported on 03/28/2015 01/05/14   Liam Graham, PA-C  meloxicam (MOBIC) 15 MG tablet Take 1 tablet (15 mg total) by mouth daily as needed for pain. Patient not taking: Reported on 03/28/2015 08/05/13   Gregor Hams, MD  naproxen (NAPROSYN) 250 MG tablet Take 1  tablet (250 mg total) by mouth 2 (two) times daily with a meal. 03/28/15   Waynetta Pean, PA-C   BP 112/72 mmHg  Pulse 88  Temp(Src) 97.9 F (36.6 C) (Oral)  Resp 18  Ht 5' (1.524 m)  Wt 200 lb (90.719 kg)  BMI 39.06 kg/m2  SpO2 99%  LMP 03/03/2015 (Approximate) Physical Exam  Constitutional: She is oriented to person, place, and time. She appears  well-developed and well-nourished. No distress.  Nontoxic appearing.  HENT:  Head: Normocephalic and atraumatic.  Right Ear: External ear normal.  Left Ear: External ear normal.  Mouth/Throat: Oropharynx is clear and moist. No oropharyngeal exudate.  Eyes: Conjunctivae are normal. Pupils are equal, round, and reactive to light. Right eye exhibits no discharge. Left eye exhibits no discharge.  Neck: Normal range of motion. Neck supple. No JVD present. No tracheal deviation present.  Cardiovascular: Normal rate, regular rhythm, normal heart sounds and intact distal pulses.  Exam reveals no gallop and no friction rub.   No murmur heard. Bilateral radial, posterior tibialis and dorsalis pedis pulses are intact.  HR 80.  Pulmonary/Chest: Effort normal and breath sounds normal. No respiratory distress. She has no wheezes. She has no rales. She exhibits tenderness.  Left sided chest is tender to palpation and reproduces her chest pain. No chest deformity, or rashes.   Abdominal: Soft. Bowel sounds are normal. She exhibits no distension. There is no tenderness. There is no guarding.  Musculoskeletal: She exhibits no edema or tenderness.  No lower extremity edema or tenderness.  Lymphadenopathy:    She has no cervical adenopathy.  Neurological: She is alert and oriented to person, place, and time. Coordination normal.  Skin: Skin is warm and dry. No rash noted. She is not diaphoretic. No erythema. No pallor.  Psychiatric: She has a normal mood and affect. Her behavior is normal.  Nursing note and vitals reviewed.   ED Course  Procedures (including critical care time) Labs Review Labs Reviewed  CBC - Abnormal; Notable for the following:    RBC 5.81 (*)    MCV 72.8 (*)    MCH 23.9 (*)    All other components within normal limits  BASIC METABOLIC PANEL  I-STAT TROPOININ, ED    Imaging Review Dg Chest 2 View  03/28/2015   CLINICAL DATA:  Left-sided chest pain for 1 day.  EXAM: CHEST  2  VIEW  COMPARISON:  None.  FINDINGS: Heart size and mediastinal contours are within normal limits. Both lungs are clear. Visualized skeletal structures are unremarkable.  IMPRESSION: Normal examination.   Electronically Signed   By: Inge Rise M.D.   On: 03/28/2015 14:18     EKG Interpretation   Date/Time:  Monday Mar 28 2015 13:46:02 EDT Ventricular Rate:  98 PR Interval:  142 QRS Duration: 74 QT Interval:  325 QTC Calculation: 415 R Axis:   63 Text Interpretation:  Sinus rhythm Confirmed by Alvino Chapel  MD, NATHAN  [phone]) on 03/28/2015 4:42:39 PM      Filed Vitals:   03/28/15 1343 03/28/15 1540 03/28/15 1544 03/28/15 1720  BP: 110/71  104/71 112/72  Pulse: 108 70  88  Temp: 98.7 F (37.1 C) 97.9 F (36.6 C)    TempSrc: Oral Oral    Resp: 18   18  Height:  5' (1.524 m)    Weight:  200 lb (90.719 kg)    SpO2: 100% 99%  99%     MDM   Meds given in ED:  Medications  ketorolac (TORADOL) injection 30 mg (30 mg Intramuscular Given 03/28/15 1659)    New Prescriptions   NAPROXEN (NAPROSYN) 250 MG TABLET    Take 1 tablet (250 mg total) by mouth 2 (two) times daily with a meal.    Final diagnoses:  Chest pain, unspecified chest pain type   This is a 29 year old female presents the emergency department complaining of gradually worsening left-sided chest pain since yesterday. On exam the patient is afebrile and nontoxic appearing. The patient's chest pain is reproducible on palpation of her left chest. Patient is to be discharged with recommendation to follow up with PCP in regards to today's hospital visit. Chest pain is not likely of cardiac or pulmonary etiology d/t presentation, VSS, no tracheal deviation, no JVD or new murmur, RRR, breath sounds equal bilaterally, EKG without acute abnormalities, negative troponin, and negative CXR. Patient without any risk factors for PE today, her initial heart rate was 107, but has remained under 100 since. She was not tachycardic during  my exam. With her pain reproducible with palpation I doubt pulmonary embolism. Pt has been advised to return to the ED if CP becomes exertional, associated with diaphoresis or nausea, radiates to left jaw/arm, worsens or becomes concerning in any way. Pt appears reliable for follow up and is agreeable to discharge. Patient given toradol injection prior to discharge for pain. I advised the patient to follow-up with their primary care provider this week. I advised the patient to return to the emergency department with new or worsening symptoms or new concerns. The patient verbalized understanding and agreement with plan.    This patient was discussed with Dr. Alvino Chapel who agrees with assessment and plan.     Waynetta Pean, PA-C 03/28/15 1733  Davonna Belling, MD 03/29/15 [phone]

## 2015-03-28 NOTE — ED Notes (Signed)
Urine collected and in bin located in triage

## 2015-03-28 NOTE — Progress Notes (Signed)
Pt states her pcp is [facility]  States she has not made her first appt with them yet  EPIC updated

## 2015-03-28 NOTE — Discharge Instructions (Signed)
Chest Pain (Nonspecific) °It is often hard to give a specific diagnosis for the cause of chest pain. There is always a chance that your pain could be related to something serious, such as a heart attack or a blood clot in the lungs. You need to follow up with your health care provider for further evaluation. °CAUSES  °· Heartburn. °· Pneumonia or bronchitis. °· Anxiety or stress. °· Inflammation around your heart (pericarditis) or lung (pleuritis or pleurisy). °· A blood clot in the lung. °· A collapsed lung (pneumothorax). It can develop suddenly on its own (spontaneous pneumothorax) or from trauma to the chest. °· Shingles infection (herpes zoster virus). °The chest wall is composed of bones, muscles, and cartilage. Any of these can be the source of the pain. °· The bones can be bruised by injury. °· The muscles or cartilage can be strained by coughing or overwork. °· The cartilage can be affected by inflammation and become sore (costochondritis). °DIAGNOSIS  °Lab tests or other studies may be needed to find the cause of your pain. Your health care provider may have you take a test called an ambulatory electrocardiogram (ECG). An ECG records your heartbeat patterns over a 24-hour period. You may also have other tests, such as: °· Transthoracic echocardiogram (TTE). During echocardiography, sound waves are used to evaluate how blood flows through your heart. °· Transesophageal echocardiogram (TEE). °· Cardiac monitoring. This allows your health care provider to monitor your heart rate and rhythm in real time. °· Holter monitor. This is a portable device that records your heartbeat and can help diagnose heart arrhythmias. It allows your health care provider to track your heart activity for several days, if needed. °· Stress tests by exercise or by giving medicine that makes the heart beat faster. °TREATMENT  °· Treatment depends on what may be causing your chest pain. Treatment may include: °· Acid blockers for  heartburn. °· Anti-inflammatory medicine. °· Pain medicine for inflammatory conditions. °· Antibiotics if an infection is present. °· You may be advised to change lifestyle habits. This includes stopping smoking and avoiding alcohol, caffeine, and chocolate. °· You may be advised to keep your head raised (elevated) when sleeping. This reduces the chance of acid going backward from your stomach into your esophagus. °Most of the time, nonspecific chest pain will improve within 2-3 days with rest and mild pain medicine.  °HOME CARE INSTRUCTIONS  °· If antibiotics were prescribed, take them as directed. Finish them even if you start to feel better. °· For the next few days, avoid physical activities that bring on chest pain. Continue physical activities as directed. °· Do not use any tobacco products, including cigarettes, chewing tobacco, or electronic cigarettes. °· Avoid drinking alcohol. °· Only take medicine as directed by your health care provider. °· Follow your health care provider's suggestions for further testing if your chest pain does not go away. °· Keep any follow-up appointments you made. If you do not go to an appointment, you could develop lasting (chronic) problems with pain. If there is any problem keeping an appointment, call to reschedule. °SEEK MEDICAL CARE IF:  °· Your chest pain does not go away, even after treatment. °· You have a rash with blisters on your chest. °· You have a fever. °SEEK IMMEDIATE MEDICAL CARE IF:  °· You have increased chest pain or pain that spreads to your arm, neck, jaw, back, or abdomen. °· You have shortness of breath. °· You have an increasing cough, or you cough   up blood.  You have severe back or abdominal pain.  You feel nauseous or vomit.  You have severe weakness.  You faint.  You have chills. This is an emergency. Do not wait to see if the pain will go away. Get medical help at once. Call your local emergency services (911 in [place] Do not drive  yourself to the hospital. MAKE SURE YOU:   Understand these instructions.  Will watch your condition.  Will get help right away if you are not doing well or get worse. Document Released: 07/25/2005 Document Revised: 10/20/2013 Document Reviewed: 05/20/2008 [facility] Patient Information 2015 [place], [place]. This information is not intended to replace advice given to you by your health care provider. Make sure you discuss any questions you have with your health care provider. Costochondritis Costochondritis, sometimes called Tietze syndrome, is a swelling and irritation (inflammation) of the tissue (cartilage) that connects your ribs with your breastbone (sternum). It causes pain in the chest and rib area. Costochondritis usually goes away on its own over time. It can take up to 6 weeks or longer to get better, especially if you are unable to limit your activities. CAUSES  Some cases of costochondritis have no known cause. Possible causes include:  Injury (trauma).  Exercise or activity such as lifting.  Severe coughing. SIGNS AND SYMPTOMS  Pain and tenderness in the chest and rib area.  Pain that gets worse when coughing or taking deep breaths.  Pain that gets worse with specific movements. DIAGNOSIS  Your health care provider will do a physical exam and ask about your symptoms. Chest X-rays or other tests may be done to rule out other problems. TREATMENT  Costochondritis usually goes away on its own over time. Your health care provider may prescribe medicine to help relieve pain. HOME CARE INSTRUCTIONS   Avoid exhausting physical activity. Try not to strain your ribs during normal activity. This would include any activities using chest, abdominal, and side muscles, especially if heavy weights are used.  Apply ice to the affected area for the first 2 days after the pain begins.  Put ice in a plastic bag.  Place a towel between your skin and the bag.  Leave the ice on for 20  minutes, 2-3 times a day.  Only take over-the-counter or prescription medicines as directed by your health care provider. SEEK MEDICAL CARE IF:  You have redness or swelling at the rib joints. These are signs of infection.  Your pain does not go away despite rest or medicine. SEEK IMMEDIATE MEDICAL CARE IF:   Your pain increases or you are very uncomfortable.  You have shortness of breath or difficulty breathing.  You cough up blood.  You have worse chest pains, sweating, or vomiting.  You have a fever or persistent symptoms for more than 2-3 days.  You have a fever and your symptoms suddenly get worse. MAKE SURE YOU:   Understand these instructions.  Will watch your condition.  Will get help right away if you are not doing well or get worse. Document Released: 07/25/2005 Document Revised: 08/05/2013 Document Reviewed: 05/19/2013 [facility] Patient Information 2015 [place], [place]. This information is not intended to replace advice given to you by your health care provider. Make sure you discuss any questions you have with your health care provider.

## 2015-10-30 NOTE — L&D Delivery Note (Signed)
Delivery Note IOL - elective 10/11/16 - 2300 -  Cervical ripening with misoprostol 50 mcg PO q 4 hrs x 4 doses 10/12/16 - 1100 - cervical balloon 10/12/16 - 2100 - Pitocin augmentation 10/13/16 - 0100 - cervical balloon out 10/13/16 - 0205 SROM, clear fluid, onset of active labor 10/13/16 - 0830 - c/c/+2, onset of active 2nd stage  FHT category I and II 1st stage of labor          Category II 2nd stage of labor Pushing with every other ctx due to deep variables with slow return to baseline, pushed in L lateral position, lots of encouragement from family and staff.  At 9:02 AM a viable female was delivered via Vaginal, Spontaneous Delivery (Presentation: OA to LOT).  APGAR: 8 , 9 ; weight  pending.   Placenta status: delivered schultz, C/I/S.  Cord:  3V with the following complications: none.    Anesthesia:  epidural Episiotomy:  none Lacerations:  1st degree vaginal floor Suture Repair: 3.0 vicryl Est. Blood Loss (mL):  200  Mom to postpartum.  Baby to Couplet care / Skin to Skin.  Juliene Pina, CNM 10/13/2016, 9:36 AM

## 2015-12-22 ENCOUNTER — Other Ambulatory Visit: Payer: Self-pay | Admitting: Nurse Practitioner

## 2016-04-04 LAB — OB RESULTS CONSOLE GC/CHLAMYDIA
Chlamydia: NEGATIVE
Gonorrhea: NEGATIVE

## 2016-05-21 ENCOUNTER — Other Ambulatory Visit (HOSPITAL_COMMUNITY): Payer: Self-pay | Admitting: Certified Nurse Midwife

## 2016-05-21 DIAGNOSIS — IMO0001 Reserved for inherently not codable concepts without codable children: Secondary | ICD-10-CM

## 2016-05-21 DIAGNOSIS — Z3689 Encounter for other specified antenatal screening: Secondary | ICD-10-CM

## 2016-05-22 ENCOUNTER — Ambulatory Visit (HOSPITAL_COMMUNITY)
Admission: RE | Admit: 2016-05-22 | Discharge: 2016-05-22 | Disposition: A | Discharge status: Home / Self Care | Payer: BLUE CROSS/BLUE SHIELD | Source: Ambulatory Visit | Attending: Certified Nurse Midwife | Admitting: Certified Nurse Midwife

## 2016-05-22 DIAGNOSIS — IMO0001 Reserved for inherently not codable concepts without codable children: Secondary | ICD-10-CM

## 2016-05-22 DIAGNOSIS — Z3A20 20 weeks gestation of pregnancy: Secondary | ICD-10-CM | POA: Diagnosis not present

## 2016-05-22 DIAGNOSIS — Z36 Encounter for antenatal screening of mother: Secondary | ICD-10-CM | POA: Diagnosis not present

## 2016-05-22 DIAGNOSIS — O99212 Obesity complicating pregnancy, second trimester: Secondary | ICD-10-CM | POA: Diagnosis present

## 2016-05-22 DIAGNOSIS — Z3689 Encounter for other specified antenatal screening: Secondary | ICD-10-CM

## 2016-05-22 DIAGNOSIS — O99213 Obesity complicating pregnancy, third trimester: Secondary | ICD-10-CM | POA: Diagnosis not present

## 2016-07-27 LAB — OB RESULTS CONSOLE RPR: RPR: NONREACTIVE

## 2016-09-15 LAB — OB RESULTS CONSOLE GBS: GBS: POSITIVE

## 2016-09-20 ENCOUNTER — Encounter (HOSPITAL_COMMUNITY): Payer: Self-pay

## 2016-09-20 ENCOUNTER — Inpatient Hospital Stay (HOSPITAL_COMMUNITY)
Admission: AD | Admit: 2016-09-20 | Discharge: 2016-09-20 | Disposition: A | Discharge status: Home / Self Care | Payer: 59 | Source: Ambulatory Visit

## 2016-09-20 DIAGNOSIS — R42 Dizziness and giddiness: Secondary | ICD-10-CM | POA: Diagnosis not present

## 2016-09-20 DIAGNOSIS — O212 Late vomiting of pregnancy: Secondary | ICD-10-CM | POA: Insufficient documentation

## 2016-09-20 DIAGNOSIS — Z3A37 37 weeks gestation of pregnancy: Secondary | ICD-10-CM | POA: Diagnosis not present

## 2016-09-20 DIAGNOSIS — K529 Noninfective gastroenteritis and colitis, unspecified: Secondary | ICD-10-CM

## 2016-09-20 DIAGNOSIS — R1110 Vomiting, unspecified: Secondary | ICD-10-CM | POA: Diagnosis present

## 2016-09-20 DIAGNOSIS — O26893 Other specified pregnancy related conditions, third trimester: Secondary | ICD-10-CM | POA: Diagnosis not present

## 2016-09-20 DIAGNOSIS — O219 Vomiting of pregnancy, unspecified: Secondary | ICD-10-CM | POA: Diagnosis present

## 2016-09-20 HISTORY — DX: Other specified health status: Z78.9

## 2016-09-20 LAB — URINE MICROSCOPIC-ADD ON: RBC / HPF: NONE SEEN RBC/hpf (ref 0–5)

## 2016-09-20 LAB — URINALYSIS, ROUTINE W REFLEX MICROSCOPIC
Bilirubin Urine: NEGATIVE
Glucose, UA: NEGATIVE mg/dL
Hgb urine dipstick: NEGATIVE
Ketones, ur: 15 mg/dL — AB
Nitrite: NEGATIVE
PH: 6 (ref 5.0–8.0)
Protein, ur: NEGATIVE mg/dL
Specific Gravity, Urine: 1.02 (ref 1.005–1.030)

## 2016-09-20 MED ORDER — SODIUM CHLORIDE 0.9 % IV SOLN
25.0000 mg | Freq: Once | INTRAVENOUS | Status: AC
  Administered 2016-09-20: 25 mg via INTRAVENOUS
  Filled 2016-09-20: qty 1

## 2016-09-20 MED ORDER — PROMETHAZINE HCL 50 MG PO TABS: 25.0000 mg | ORAL_TABLET | ORAL | 0 refills | Status: DC | PRN

## 2016-09-20 NOTE — MAU Provider Note (Signed)
History    Hayley Chambers is a G1 at [redacted]w[redacted]d per sure LMP, PNC at [facility], uncomplicated course to date. Presents to MAU w/ c/o frequent emesis episodes since late last night, last one on the way to hospital. Unable to keep any food down, taking sips of water and ginger ale. + abdominal cramping, no diarrhea. No LOF/VB, good FM noted.   Chief Complaint  Patient presents with  . Emesis During Pregnancy  . Dizziness  . Abdominal Pain   Dizziness  Associated symptoms include abdominal pain.  Abdominal Pain       Past Medical History:  Diagnosis Date  . Medical history non-contributory     Past Surgical History:  Procedure Laterality Date  . ECTOPIC PREGNANCY SURGERY    . TONSILLECTOMY      History reviewed. No pertinent family history.  Social History  Substance Use Topics  . Smoking status: Never Smoker  . Smokeless tobacco: Never Used  . Alcohol use No    Allergies: No Known Allergies  Prescriptions Prior to Admission  Medication Sig Dispense Refill Last Dose  . cefUROXime (CEFTIN) 250 MG tablet Take 1 tablet (250 mg total) by mouth 2 (two) times daily with a meal. (Patient not taking: Reported on 03/28/2015) 10 tablet 0 Completed Course  . fluconazole (DIFLUCAN) 150 MG tablet Take 1 tablet (150 mg total) by mouth once. Pick up the refill and and take second dose in 5 days if symptoms have not resolved (Patient not taking: Reported on 03/28/2015) 1 tablet 1 Completed Course  . ibuprofen (ADVIL,MOTRIN) 200 MG tablet Take 600 mg by mouth every 6 (six) hours as needed for headache.   03/27/2015  . meloxicam (MOBIC) 15 MG tablet Take 1 tablet (15 mg total) by mouth daily as needed for pain. (Patient not taking: Reported on 03/28/2015) 30 tablet 0 Completed Course  . naproxen (NAPROSYN) 250 MG tablet Take 1 tablet (250 mg total) by mouth 2 (two) times daily with a meal. 30 tablet 0     Review of Systems  Gastrointestinal: Positive for abdominal pain.  Neurological:  Positive for dizziness.   Physical Exam     Physical Exam: Blood pressure 116/72, pulse 111, temperature 98.2 F (36.8 C), resp. rate 18, height 5' (1.524 m), weight 97.1 kg (214 lb), last menstrual period 01/01/2016. General: NAD Heart: RRR, no murmurs Lungs: CTA b/l  Abd: Soft, NT, S=D Ext: no edema GU: SVE closed/thick/high Neuro: DTRs normal   ED Course  Procedures   IV LR 1 L bolus with promethazine 25 mg IV  Recent Results (from the past 2160 hour(s))  Urinalysis, Routine w reflex microscopic (not at [facility] [place] [facility])     Status: Abnormal   Collection Time: 09/20/16  2:30 PM  Result Value Ref Range   Color, Urine YELLOW YELLOW   APPearance CLEAR CLEAR   Specific Gravity, Urine 1.020 1.005 - 1.030   pH 6.0 5.0 - 8.0   Glucose, UA NEGATIVE NEGATIVE mg/dL   Hgb urine dipstick NEGATIVE NEGATIVE   Bilirubin Urine NEGATIVE NEGATIVE   Ketones, ur 15 (A) NEGATIVE mg/dL   Protein, ur NEGATIVE NEGATIVE mg/dL   Nitrite NEGATIVE NEGATIVE   Leukocytes, UA TRACE (A) NEGATIVE  Urine microscopic-add on     Status: Abnormal   Collection Time: 09/20/16  2:30 PM  Result Value Ref Range   Squamous Epithelial / LPF 0-5 (A) NONE SEEN   WBC, UA 0-5 0 - 5 WBC/hpf   RBC / HPF NONE  SEEN 0 - 5 RBC/hpf   Bacteria, UA FEW (A) NONE SEEN   A/P: IUP early term FHT surveillance category 1 NIL Suspect viral gastroenteritis Hydrate w/ IV LR Promethazine Rx 25 mg PO q 4-6 hrs PRN # 30 Good hand hygiene  BRAT diet next few days, maintain hydration, monitor FM daily F/U w/ scheduled prenatal visit  Juliene Pina, CNM, MSN 09/20/2016, 3:51 PM

## 2016-09-20 NOTE — MAU Note (Signed)
Patient presents with vomiting since early evening last night, can't keep anything down, dizzy, abdominal pain.

## 2016-10-11 ENCOUNTER — Inpatient Hospital Stay (HOSPITAL_COMMUNITY)
Admission: AD | Admit: 2016-10-11 | Discharge: 2016-10-15 | DRG: 775 | Disposition: A | Discharge status: Home / Self Care | Payer: 59 | Source: Ambulatory Visit | Attending: Obstetrics and Gynecology | Admitting: Obstetrics and Gynecology

## 2016-10-11 ENCOUNTER — Other Ambulatory Visit: Payer: Self-pay

## 2016-10-11 ENCOUNTER — Telehealth (HOSPITAL_COMMUNITY): Payer: Self-pay | Admitting: *Deleted

## 2016-10-11 ENCOUNTER — Encounter (HOSPITAL_COMMUNITY): Payer: Self-pay

## 2016-10-11 ENCOUNTER — Encounter (HOSPITAL_COMMUNITY): Payer: Self-pay | Admitting: *Deleted

## 2016-10-11 DIAGNOSIS — O219 Vomiting of pregnancy, unspecified: Secondary | ICD-10-CM

## 2016-10-11 DIAGNOSIS — O99824 Streptococcus B carrier state complicating childbirth: Secondary | ICD-10-CM | POA: Diagnosis present

## 2016-10-11 DIAGNOSIS — O99214 Obesity complicating childbirth: Secondary | ICD-10-CM | POA: Diagnosis present

## 2016-10-11 DIAGNOSIS — Z6841 Body Mass Index (BMI) 40.0 and over, adult: Secondary | ICD-10-CM

## 2016-10-11 DIAGNOSIS — Z8249 Family history of ischemic heart disease and other diseases of the circulatory system: Secondary | ICD-10-CM

## 2016-10-11 DIAGNOSIS — Z3490 Encounter for supervision of normal pregnancy, unspecified, unspecified trimester: Secondary | ICD-10-CM

## 2016-10-11 DIAGNOSIS — Z3A40 40 weeks gestation of pregnancy: Secondary | ICD-10-CM

## 2016-10-11 DIAGNOSIS — O26893 Other specified pregnancy related conditions, third trimester: Secondary | ICD-10-CM | POA: Diagnosis present

## 2016-10-11 HISTORY — DX: Unspecified asthma, uncomplicated: J45.909

## 2016-10-11 LAB — CBC
HCT: 34.5 % — ABNORMAL LOW (ref 36.0–46.0)
Hemoglobin: 11.9 g/dL — ABNORMAL LOW (ref 12.0–15.0)
MCH: 24.4 pg — ABNORMAL LOW (ref 26.0–34.0)
MCHC: 34.5 g/dL (ref 30.0–36.0)
MCV: 70.7 fL — ABNORMAL LOW (ref 78.0–100.0)
PLATELETS: 283 10*3/uL (ref 150–400)
RBC: 4.88 MIL/uL (ref 3.87–5.11)
RDW: 15.4 % (ref 11.5–15.5)
WBC: 6.8 10*3/uL (ref 4.0–10.5)

## 2016-10-11 LAB — TYPE AND SCREEN
ABO/RH(D): O POS
ANTIBODY SCREEN: NEGATIVE

## 2016-10-11 MED ORDER — SOD CITRATE-CITRIC ACID 500-334 MG/5ML PO SOLN: 30.0000 mL | ORAL | Status: DC | PRN

## 2016-10-11 MED ORDER — ACETAMINOPHEN 325 MG PO TABS: 650.0000 mg | ORAL_TABLET | ORAL | Status: DC | PRN

## 2016-10-11 MED ORDER — MISOPROSTOL 50MCG HALF TABLET
50.0000 ug | ORAL_TABLET | ORAL | Status: DC
  Administered 2016-10-11 – 2016-10-12 (×4): 50 ug via ORAL
  Filled 2016-10-11 (×4): qty 0.5

## 2016-10-11 MED ORDER — LIDOCAINE HCL (PF) 1 % IJ SOLN
30.0000 mL | INTRAMUSCULAR | Status: DC | PRN
  Filled 2016-10-11: qty 30

## 2016-10-11 MED ORDER — OXYTOCIN 40 UNITS IN LACTATED RINGERS INFUSION - SIMPLE MED
2.5000 [IU]/h | INTRAVENOUS | Status: DC
  Administered 2016-10-13: 2.5 [IU]/h via INTRAVENOUS
  Filled 2016-10-11: qty 1000

## 2016-10-11 MED ORDER — LACTATED RINGERS IV SOLN: 500.0000 mL | INTRAVENOUS | Status: DC | PRN

## 2016-10-11 MED ORDER — ONDANSETRON HCL 4 MG/2ML IJ SOLN: 4.0000 mg | Freq: Four times a day (QID) | INTRAMUSCULAR | Status: DC | PRN

## 2016-10-11 MED ORDER — OXYTOCIN 10 UNIT/ML IJ SOLN
10.0000 [IU] | Freq: Once | INTRAMUSCULAR | Status: DC
Start: 2016-10-11 — End: 2016-10-13

## 2016-10-11 MED ORDER — TERBUTALINE SULFATE 1 MG/ML IJ SOLN
0.2500 mg | Freq: Once | INTRAMUSCULAR | Status: DC | PRN
  Filled 2016-10-11: qty 1

## 2016-10-11 MED ORDER — OXYTOCIN BOLUS FROM INFUSION
500.0000 mL | Freq: Once | INTRAVENOUS | Status: AC
  Administered 2016-10-13: 500 mL via INTRAVENOUS

## 2016-10-11 MED ORDER — ZOLPIDEM TARTRATE 5 MG PO TABS
5.0000 mg | ORAL_TABLET | Freq: Every evening | ORAL | Status: DC | PRN
  Administered 2016-10-11: 5 mg via ORAL
  Filled 2016-10-11: qty 1

## 2016-10-11 NOTE — H&P (Signed)
OB ADMISSION/ HISTORY & PHYSICAL:  Admission Date: 10/11/2016  9:51 PM  Admit Diagnosis: Term pregnancy, elective IOL  Hayley Chambers is a 30 y.o. female presenting for elective IOL at [redacted]w[redacted]d.  Prenatal History: G3P0020   EDC : 10/07/2016, by Last Menstrual Period  Prenatal care at [facility] since [redacted] weeks gestation  Prenatal course complicated by perineal rash, known lichens, worsened during pregnancy  Prenatal Labs: ABO, Rh:   O pos Antibody:  neg Rubella:   immune RPR:   NR HBsAg:   Neg HIV:   Neg GC/CT neg GBS:   Positive 2 hr Glucola : 46, 84, 68 Genetic screen declined   Medical / Surgical History :  Past medical history:  Past Medical History:  Diagnosis Date  . Complication of anesthesia   . Medical history non-contributory   . PONV (postoperative nausea and vomiting)      Past surgical history:  Past Surgical History:  Procedure Laterality Date  . ECTOPIC PREGNANCY SURGERY    . TONSILLECTOMY       Family History:  Family History  Problem Relation Age of Onset  . Alcohol abuse Mother   . Miscarriages / [place] Mother   . Birth defects Sister     cleft lip and palate  . Mental illness Sister     bipolar  . Miscarriages / Stillbirths Sister   . Heart disease Maternal Grandmother   . Cancer Maternal Grandfather   . Heart disease Paternal Grandmother   . Arthritis Neg Hx   . Asthma Neg Hx   . COPD Neg Hx   . Depression Neg Hx   . Diabetes Neg Hx   . Drug abuse Neg Hx   . Early death Neg Hx   . Hearing loss Neg Hx   . Hyperlipidemia Neg Hx   . Hypertension Neg Hx   . Kidney disease Neg Hx   . Learning disabilities Neg Hx   . Mental retardation Neg Hx   . Stroke Neg Hx   . Vision loss Neg Hx   . Varicose Veins Neg Hx      Social History:  reports that she has never smoked. She has never used smokeless tobacco. She reports that she does not drink alcohol or use drugs.   Allergies: Patient has no known allergies.     Current Medications at time of admission:  Prescriptions Prior to Admission  Medication Sig Dispense Refill Last Dose  . fluconazole (DIFLUCAN) 150 MG tablet Take 1 tablet (150 mg total) by mouth once. Pick up the refill and and take second dose in 5 days if symptoms have not resolved 1 tablet 1 Completed Course  . promethazine (PHENERGAN) 50 MG tablet Take 0.5 tablets (25 mg total) by mouth every 4 (four) hours as needed for nausea or vomiting. 30 tablet 0       Review of Systems: ROS  Neg for HA/NV/RUQ pain Neg ctx, VB, LOF +FM     Physical Exam:  Dilation: Closed Exam by:: Melina Copa, CNM  Vitals:   10/11/16 2223  BP: 115/64  Pulse: 93  Resp: 17  Temp: 97.9 F (36.6 C)   General: AAO x 3, NAD Heart:RRR Lungs:CTAB Abdomen: gravid, NT, S=D Extremities: +1 pedal edema Genitalia / VE: perineal rash, unchanged FHR: 140, mod var, + accels, no decels TOCO: irreg ctx, patient does not feel them  Labs:    CBC, T&S, RPR pending   Assessment:  30 y.o. [id number] at [redacted]w[redacted]d  1. Labor: unfavorable cervix 2. Fetal Wellbeing: Category 1  3. Pain Control: desires unmedicated birth / hydrotherapy 4. GBS: positive  Plan:  1. Admit to BS, misoprostol 50 mcg q 4 hrs for cervical ripening, cervical balloon when achievable 2. Routine L&D orders 3. Analgesia/anesthesia PRN  4. GBS prophylaxis with onset of active labor    Consultant: Dr. Penelope Coop, CNM, MSN 10/11/2016, 10:50 PM

## 2016-10-11 NOTE — Telephone Encounter (Signed)
Preadmission screen  

## 2016-10-12 ENCOUNTER — Inpatient Hospital Stay (HOSPITAL_COMMUNITY): Admission: RE | Admit: 2016-10-12 | Payer: 59 | Source: Ambulatory Visit

## 2016-10-12 LAB — RPR: RPR Ser Ql: NONREACTIVE

## 2016-10-12 LAB — ABO/RH: ABO/RH(D): O POS

## 2016-10-12 MED ORDER — OXYTOCIN 40 UNITS IN LACTATED RINGERS INFUSION - SIMPLE MED
1.0000 m[IU]/min | INTRAVENOUS | Status: DC
Start: 2016-10-12 — End: 2016-10-13
  Administered 2016-10-12: 2 m[IU]/min via INTRAVENOUS

## 2016-10-12 MED ORDER — PENICILLIN G POTASSIUM 5000000 UNITS IJ SOLR: 5.0000 10*6.[IU] | Freq: Once | INTRAMUSCULAR | Status: DC

## 2016-10-12 MED ORDER — PHENYLEPHRINE 40 MCG/ML (10ML) SYRINGE FOR IV PUSH (FOR BLOOD PRESSURE SUPPORT)
80.0000 ug | PREFILLED_SYRINGE | INTRAVENOUS | Status: DC | PRN
  Filled 2016-10-12: qty 5

## 2016-10-12 MED ORDER — EPHEDRINE 5 MG/ML INJ
10.0000 mg | INTRAVENOUS | Status: DC | PRN
Start: 2016-10-12 — End: 2016-10-13
  Filled 2016-10-12: qty 4

## 2016-10-12 MED ORDER — FAMOTIDINE 20 MG PO TABS
20.0000 mg | ORAL_TABLET | ORAL | Status: DC | PRN
  Administered 2016-10-12: 20 mg via ORAL
  Filled 2016-10-12: qty 1

## 2016-10-12 MED ORDER — BUTORPHANOL TARTRATE 1 MG/ML IJ SOLN
2.0000 mg | Freq: Once | INTRAMUSCULAR | Status: AC
Start: 2016-10-12 — End: 2016-10-12
  Administered 2016-10-12: 2 mg via INTRAVENOUS
  Filled 2016-10-12: qty 2

## 2016-10-12 MED ORDER — PENICILLIN G POTASSIUM 5000000 UNITS IJ SOLR
5.0000 10*6.[IU] | Freq: Once | INTRAMUSCULAR | Status: AC
  Administered 2016-10-12: 5 10*6.[IU] via INTRAVENOUS
  Filled 2016-10-12: qty 5

## 2016-10-12 MED ORDER — PHENYLEPHRINE 40 MCG/ML (10ML) SYRINGE FOR IV PUSH (FOR BLOOD PRESSURE SUPPORT)
80.0000 ug | PREFILLED_SYRINGE | INTRAVENOUS | Status: DC | PRN
Start: 2016-10-12 — End: 2016-10-13
  Administered 2016-10-13 (×2): 80 ug via INTRAVENOUS
  Filled 2016-10-12: qty 5

## 2016-10-12 MED ORDER — DIPHENHYDRAMINE HCL 50 MG/ML IJ SOLN: 12.5000 mg | INTRAMUSCULAR | Status: DC | PRN

## 2016-10-12 MED ORDER — FENTANYL 2.5 MCG/ML BUPIVACAINE 1/10 % EPIDURAL INFUSION (WH - ANES)
14.0000 mL/h | INTRAMUSCULAR | Status: DC | PRN
  Administered 2016-10-13 (×2): 14 mL/h via EPIDURAL
  Filled 2016-10-12: qty 100

## 2016-10-12 MED ORDER — PROMETHAZINE HCL 25 MG/ML IJ SOLN
12.5000 mg | Freq: Once | INTRAMUSCULAR | Status: AC
  Administered 2016-10-12: 12.5 mg via INTRAVENOUS
  Filled 2016-10-12: qty 1

## 2016-10-12 MED ORDER — BUTORPHANOL TARTRATE 1 MG/ML IJ SOLN
2.0000 mg | Freq: Once | INTRAMUSCULAR | Status: AC
  Administered 2016-10-12: 2 mg via INTRAVENOUS
  Filled 2016-10-12: qty 2

## 2016-10-12 MED ORDER — LACTATED RINGERS IV SOLN: 500.0000 mL | Freq: Once | INTRAVENOUS | Status: DC

## 2016-10-12 MED ORDER — BUTORPHANOL TARTRATE 1 MG/ML IJ SOLN
INTRAMUSCULAR | Status: AC
  Filled 2016-10-12: qty 1

## 2016-10-12 MED ORDER — PENICILLIN G POTASSIUM 5000000 UNITS IJ SOLR: 2.5000 10*6.[IU] | INTRAMUSCULAR | Status: DC

## 2016-10-12 MED ORDER — PENICILLIN G POT IN DEXTROSE 60000 UNIT/ML IV SOLN
3.0000 10*6.[IU] | INTRAVENOUS | Status: DC
  Administered 2016-10-13 (×2): 3 10*6.[IU] via INTRAVENOUS
  Filled 2016-10-12 (×4): qty 50

## 2016-10-12 MED ORDER — EPHEDRINE 5 MG/ML INJ
10.0000 mg | INTRAVENOUS | Status: DC | PRN
  Filled 2016-10-12: qty 4

## 2016-10-12 MED ORDER — TERBUTALINE SULFATE 1 MG/ML IJ SOLN
0.2500 mg | Freq: Once | INTRAMUSCULAR | Status: AC | PRN
  Administered 2016-10-13: 0.25 mg via SUBCUTANEOUS

## 2016-10-12 NOTE — Progress Notes (Addendum)
Hayley Chambers is a 30 y.o. G3P0020 at [redacted]w[redacted]d by LMP admitted for EIOL at [redacted]w[redacted]d  Subjective:  Mild cramping this am, was able to sleep overnight w/ Ambien. FOB not involved, friends at [facility] and supportive.   S/P misoprostol 50 mcg PO x 2  Objective: Vitals:   10/11/16 2220 10/11/16 2223 10/12/16 0304  BP:  115/64 (!) 94/51  Pulse:  93 (!) 109  Resp:  17 18  Temp:  97.9 F (36.6 C) 98.4 F (36.9 C)  TempSrc:  Oral Oral  Weight: 98.4 kg (217 lb)    Height: 5' (1.524 m)       FHT:  FHR: 135 bpm, variability: moderate,  accelerations:  Present,  decelerations:  Absent UC:   regular, every 2-3 minutes, mild SVE:   Dilation: Ft Effacement (%): Thick Exam by:: Melina Copa, CNM  Attempt to place foley cervical balloon unsuccessful  Labs:   Recent Labs  10/11/16 2239  WBC 6.8  HGB 11.9*  HCT 34.5*  PLT 283    Assessment / Plan: Induction of labor due to elective  Labor: cervical ripening ongoing, repeat misoprostol PO Preeclampsia:  no signs or symptoms of toxicity Fetal Wellbeing:  Category I Pain Control:  Labor support without medications I/D:  GBS pos, will start prophylaxis with active labor Anticipated MOD:  NSVD  Juliene Pina, CNM, MSN 10/12/2016, 7:48 AM   Cervical balloon placed with some difficulty, patient tolerated w/ coaching. 60 cc LR inflated and traction to leg. SVX 1/50/-2, membranes stripped  Juliene Pina, CNM 10/12/2016 11:19 AM

## 2016-10-12 NOTE — Progress Notes (Signed)
Provider at bedside and instructed to apply a 500cc bag as traction to foley bulb.

## 2016-10-12 NOTE — Progress Notes (Signed)
S: Sleepy post 2nd dose of Stadol IV. Used Nitrous gas earlier w/ good relief. Contractions starting to space out.  S/p cervical ripening w. Cervical balloon and oral miso x 4 doses, last one at 1600.   O: Vitals:   10/12/16 1520 10/12/16 1606 10/12/16 1900 10/12/16 1946  BP:  116/77  113/67  Pulse:  90  (!) 103  Resp:  18 18 16   Temp:  98.3 F (36.8 C)  99 F (37.2 C)  TempSrc:  Oral  Axillary  SpO2: 100%     Weight:      Height:         FHT:  FHR: 130 bpm, variability: moderate,  accelerations:  Present,  decelerations:  Present occasional variables ans couple of late decels, resolved w/ repositioning UC:   irregular, every 2-6 minutes, mild SVE:   3/90/-2, cervical balloon stil in place, some bloody show, LUS thined  A / P: cervical ripening w/ slow progress Start Pitocin, traction to cervical balloon 0.5 L fluid bag added Fetal Wellbeing:  Category I Pain Control:  IV pain meds and Nitrous Oxide I/D:GBS pos, will start IV PCN prophylaxis per protocol  Anticipated MOD:  cautious NSVD  Juliene Pina, CNM, MSN 10/12/2016, 9:00 PM

## 2016-10-13 ENCOUNTER — Inpatient Hospital Stay (HOSPITAL_COMMUNITY): Payer: 59 | Admitting: Anesthesiology

## 2016-10-13 ENCOUNTER — Encounter (HOSPITAL_COMMUNITY): Payer: Self-pay | Admitting: Anesthesiology

## 2016-10-13 MED ORDER — RISAQUAD PO CAPS
1.0000 | ORAL_CAPSULE | Freq: Every day | ORAL | Status: DC
  Administered 2016-10-13 – 2016-10-15 (×3): 1 via ORAL
  Filled 2016-10-13 (×4): qty 1

## 2016-10-13 MED ORDER — LIDOCAINE HCL (PF) 1 % IJ SOLN
INTRAMUSCULAR | Status: DC | PRN
  Administered 2016-10-13: 6 mL via EPIDURAL
  Administered 2016-10-13: 7 mL via EPIDURAL

## 2016-10-13 MED ORDER — PHENYLEPHRINE 40 MCG/ML (10ML) SYRINGE FOR IV PUSH (FOR BLOOD PRESSURE SUPPORT)
PREFILLED_SYRINGE | INTRAVENOUS | Status: AC
  Filled 2016-10-13: qty 20

## 2016-10-13 MED ORDER — IBUPROFEN 600 MG PO TABS
600.0000 mg | ORAL_TABLET | Freq: Four times a day (QID) | ORAL | Status: DC
  Administered 2016-10-13 – 2016-10-15 (×9): 600 mg via ORAL
  Filled 2016-10-13 (×9): qty 1

## 2016-10-13 MED ORDER — BISACODYL 10 MG RE SUPP: 10.0000 mg | Freq: Every day | RECTAL | Status: DC | PRN

## 2016-10-13 MED ORDER — DIBUCAINE 1 % RE OINT
1.0000 | TOPICAL_OINTMENT | RECTAL | Status: DC | PRN
Start: 2016-10-13 — End: 2016-10-15

## 2016-10-13 MED ORDER — SENNOSIDES-DOCUSATE SODIUM 8.6-50 MG PO TABS
2.0000 | ORAL_TABLET | ORAL | Status: DC
  Administered 2016-10-14 (×2): 2 via ORAL
  Filled 2016-10-13 (×2): qty 2

## 2016-10-13 MED ORDER — ONDANSETRON HCL 4 MG PO TABS: 4.0000 mg | ORAL_TABLET | ORAL | Status: DC | PRN

## 2016-10-13 MED ORDER — TETANUS-DIPHTH-ACELL PERTUSSIS 5-2.5-18.5 LF-MCG/0.5 IM SUSP: 0.5000 mL | Freq: Once | INTRAMUSCULAR | Status: DC

## 2016-10-13 MED ORDER — COCONUT OIL OIL
1.0000 "application " | TOPICAL_OIL | Status: DC | PRN
  Administered 2016-10-14: 1 via TOPICAL
  Filled 2016-10-13: qty 120

## 2016-10-13 MED ORDER — ACETAMINOPHEN 325 MG PO TABS: 650.0000 mg | ORAL_TABLET | ORAL | Status: DC | PRN

## 2016-10-13 MED ORDER — BENZOCAINE-MENTHOL 20-0.5 % EX AERO
1.0000 "application " | INHALATION_SPRAY | CUTANEOUS | Status: DC | PRN
  Administered 2016-10-14: 1 via TOPICAL
  Filled 2016-10-13: qty 56

## 2016-10-13 MED ORDER — WITCH HAZEL-GLYCERIN EX PADS: 1.0000 "application " | MEDICATED_PAD | CUTANEOUS | Status: DC | PRN

## 2016-10-13 MED ORDER — DIPHENHYDRAMINE HCL 25 MG PO CAPS: 25.0000 mg | ORAL_CAPSULE | Freq: Four times a day (QID) | ORAL | Status: DC | PRN

## 2016-10-13 MED ORDER — PRENATAL MULTIVITAMIN CH
1.0000 | ORAL_TABLET | Freq: Every day | ORAL | Status: DC
  Administered 2016-10-13 – 2016-10-15 (×3): 1 via ORAL
  Filled 2016-10-13 (×3): qty 1

## 2016-10-13 MED ORDER — ZOLPIDEM TARTRATE 5 MG PO TABS
5.0000 mg | ORAL_TABLET | Freq: Every evening | ORAL | Status: DC | PRN
Start: 2016-10-13 — End: 2016-10-15

## 2016-10-13 MED ORDER — FENTANYL 2.5 MCG/ML BUPIVACAINE 1/10 % EPIDURAL INFUSION (WH - ANES)
INTRAMUSCULAR | Status: AC
  Filled 2016-10-13: qty 100

## 2016-10-13 MED ORDER — ONDANSETRON HCL 4 MG/2ML IJ SOLN: 4.0000 mg | INTRAMUSCULAR | Status: DC | PRN

## 2016-10-13 MED ORDER — FLEET ENEMA 7-19 GM/118ML RE ENEM: 1.0000 | ENEMA | Freq: Every day | RECTAL | Status: DC | PRN

## 2016-10-13 MED ORDER — SIMETHICONE 80 MG PO CHEW: 80.0000 mg | CHEWABLE_TABLET | ORAL | Status: DC | PRN

## 2016-10-13 NOTE — Progress Notes (Signed)
S: Comfortable w/ epidural.  Called to BS by RN to assess fetal strip.   O: Vitals:   10/12/16 2230 10/12/16 2305 10/12/16 2330 10/13/16 0000  BP: 112/75 122/82 110/65   Pulse: (!) 119 (!) 106 91   Resp: 18 20 19 18   Temp:      TempSrc:      SpO2:      Weight:      Height:         FHT:  120's, moderate var, + accels, no decels at present, episode of repetitive decels after epidural placement x 20 min, suspect hypotension post epidural induction UC:   regular, every 3-4 minutes, mild SVE:   Dilation: 3.5 Effacement (%): 90 Station: -2 Exam by:: Shay payne RN    A / P: Protracted latent phase, off Pitocin x 15 min to allow fetal recovery Restart Pitocin at half dose previous (4 mu/min)  Fetal Wellbeing:  Category I and Category II Pain Control:  Epidural I/D: GBS prophylaxis Anticipated MOD: cautious NSVD  Juliene Pina, CNM, MSN 10/13/2016, 1:09 AM

## 2016-10-13 NOTE — Progress Notes (Signed)
S: Anxious, resting on and off through the night, pain controlled with  Epidural, pelvic pressure intermittently. Friends and family supportive at [facility].  Pitocin 6 mu/min   O: Vitals:   10/13/16 0500 10/13/16 0530 10/13/16 0531 10/13/16 0607  BP: 105/60 (!) 94/52  118/70  Pulse: (!) 104 (!) 102 (!) 104 (!) 118  Resp: 20 18    Temp: 98.8 F (37.1 C)     TempSrc: Oral     SpO2: 98% 97%  98%  Weight:      Height:         FHT:  FHR: 150 bpm, variability: moderate,  accelerations:  Present,  decelerations:  Present variable and lates with supine positioning during IUPC insertion, resolved w/ lateralpositioning and O2 via FM.  UC:   regular, every 3 min minutes, MVU = 225 SVE:   Dilation: 4 Effacement (%): 90 Station: -1 Exam by:: Eddie Dibbles, D CNM  AROM forebag, minimal clear fluid noted, SROM at 0205 IUPC placed   A / P: Protracted latent phase, on Pitocin - continue Pitocin as is, MVU adequate.  Fetal Wellbeing:  Category II Pain Control:  Epidural I/D: GBS prophylaxis ongoing Maternal tachy noted, anxiety vs possible chorio, Later less likely as no fetal tachy or maternal temp. Will monitor closely for developing chorio.  Anticipated MOD:  cautious anticipate NSVB, discussed possibility of C/S for NRFHT if late decels repetitive with intrauterine resuscitation.    Will update Dr. Pamala Hurry w/ patient status.  Juliene Pina, CNM, MSN 10/13/2016, 6:38 AM

## 2016-10-13 NOTE — Anesthesia Postprocedure Evaluation (Signed)
Anesthesia Post Note  Patient: Hayley Chambers  Procedure(s) Performed: * No procedures listed *  Patient location during evaluation: Mother Baby Anesthesia Type: Epidural Level of consciousness: awake and alert, oriented and patient cooperative Pain management: pain level controlled Vital Signs Assessment: post-procedure vital signs reviewed and stable Respiratory status: spontaneous breathing Cardiovascular status: stable Postop Assessment: no headache, epidural receding, patient able to bend at knees and no signs of nausea or vomiting Anesthetic complications: no Comments: Pain score 1.     Last Vitals:  Vitals:   10/13/16 1200 10/13/16 1600  BP: 115/75 (!) 110/48  Pulse: (!) 113 89  Resp: 18   Temp: 36.8 C 36.9 C    Last Pain:  Vitals:   10/13/16 1600  TempSrc: Oral  PainSc: 2    Pain Goal:                 [facility]

## 2016-10-13 NOTE — Anesthesia Procedure Notes (Signed)
Epidural Patient location during procedure: OB Start time: 10/13/2016 12:28 AM End time: 10/13/2016 12:32 AM  Staffing Anesthesiologist: Lyn Hollingshead Performed: anesthesiologist   Preanesthetic Checklist Completed: patient identified, surgical consent, pre-op evaluation, timeout performed, IV checked, risks and benefits discussed and monitors and equipment checked  Epidural Patient position: sitting Prep: site prepped and draped and DuraPrep Patient monitoring: continuous pulse ox and blood pressure Approach: midline Location: L3-L4 Injection technique: LOR air  Needle:  Needle type: Tuohy  Needle gauge: 17 G Needle length: 9 cm and 9 Needle insertion depth: 8 cm Catheter type: closed end flexible Catheter size: 19 Gauge Catheter at skin depth: 13 cm Test dose: negative and Other  Assessment Sensory level: T9 Events: blood not aspirated, injection not painful, no injection resistance, negative IV test and no paresthesia  Additional Notes Reason for block:procedure for pain

## 2016-10-13 NOTE — Lactation Note (Signed)
This note was copied from a baby's chart. Lactation Consultation Note  Patient Name: Hayley Chambers [id number] Date: 10/13/2016 Reason for consult: Initial assessment   Initial breastfeeding basics and handouts reviewed with mother and family.  Mother requests assistance with latch on left breast, stating that infant has not successfully latched on this side.  Worked with mother for 5 minutes on left breast, but infant folding top lip in and not able to obtain deep latch.  Mother states that she does not want her baby to be upset, so she only wants to feed from the right breast.  Need to stimulate both breasts explained, and mother states that she will attempt from the left breast with the next feeding and agrees to help with hand expression from left breast now.  1 ml EBM obtained and spoon fed to infant after a 35 minute successful feeding from right breast. Maternal Data Has patient been taught Hand Expression?: Yes  Feeding Feeding Type: Breast Fed Length of feed: 35 min (Right Breast)  LATCH Score/Interventions Latch: Repeated attempts needed to sustain latch, nipple held in mouth throughout feeding, stimulation needed to elicit sucking reflex. Intervention(s): Adjust position;Assist with latch  Audible Swallowing: A few with stimulation Intervention(s): Hand expression;Skin to skin  Type of Nipple: Everted at rest and after stimulation  Comfort (Breast/Nipple): Soft / non-tender     Hold (Positioning): Full assist, staff holds infant at breast Intervention(s): Breastfeeding basics reviewed;Support Pillows;Position options;Skin to skin  LATCH Score: 6  Lactation Tools Discussed/Used     Consult Status      [name] 10/13/2016, 6:51 PM

## 2016-10-13 NOTE — Anesthesia Preprocedure Evaluation (Signed)
Anesthesia Evaluation  Patient identified by MRN, date of birth, ID band Patient awake    Reviewed: Allergy & Precautions, H&P , NPO status , Patient's Chart, lab work & pertinent test results  Airway Mallampati: II  TM Distance: >3 FB Neck ROM: full    Dental no notable dental hx.    Pulmonary    Pulmonary exam normal        Cardiovascular negative cardio ROS Normal cardiovascular exam     Neuro/Psych negative neurological ROS  negative psych ROS   GI/Hepatic negative GI ROS, Neg liver ROS,   Endo/Other  Morbid obesity  Renal/GU negative Renal ROS     Musculoskeletal   Abdominal (+) + obese,   Peds  Hematology negative hematology ROS (+)   Anesthesia Other Findings   Reproductive/Obstetrics (+) Pregnancy                             Anesthesia Physical Anesthesia Plan  ASA: III  Anesthesia Plan: Epidural   Post-op Pain Management:    Induction:   Airway Management Planned:   Additional Equipment:   Intra-op Plan:   Post-operative Plan:   Informed Consent: I have reviewed the patients History and Physical, chart, labs and discussed the procedure including the risks, benefits and alternatives for the proposed anesthesia with the patient or authorized representative who has indicated his/her understanding and acceptance.     Plan Discussed with:   Anesthesia Plan Comments:         Anesthesia Quick Evaluation

## 2016-10-13 NOTE — Anesthesia Pain Management Evaluation Note (Signed)
  CRNA Pain Management Visit Note  Patient: Hayley Chambers, 30 y.o., female  "Hello I am a member of the anesthesia team at [facility]. We have an anesthesia team available at all times to provide care throughout the hospital, including epidural management and anesthesia for C-section. I don't know your plan for the delivery whether it a natural birth, water birth, IV sedation, nitrous supplementation, doula or epidural, but we want to meet your pain goals."   1.Was your pain managed to your expectations on prior hospitalizations?   No prior hospitalizations  2.What is your expectation for pain management during this hospitalization?     Epidural  3.How can we help you reach that goal?   Record the patient's initial score and the patient's pain goal.   Pain: 0  Pain Goal: 2 [facility] wants you to be able to say your pain was always managed very well.  Hayley Chambers 10/13/2016

## 2016-10-14 LAB — CBC
HCT: 31.1 % — ABNORMAL LOW (ref 36.0–46.0)
HEMOGLOBIN: 10.6 g/dL — AB (ref 12.0–15.0)
MCH: 24.5 pg — ABNORMAL LOW (ref 26.0–34.0)
MCHC: 34.1 g/dL (ref 30.0–36.0)
MCV: 71.8 fL — ABNORMAL LOW (ref 78.0–100.0)
PLATELETS: 242 10*3/uL (ref 150–400)
RBC: 4.33 MIL/uL (ref 3.87–5.11)
RDW: 15.7 % — ABNORMAL HIGH (ref 11.5–15.5)
WBC: 12.4 10*3/uL — AB (ref 4.0–10.5)

## 2016-10-14 NOTE — Progress Notes (Signed)
PPD # 1 SVD Information for the patient's newborn:  Hayley Chambers, Hayley Chambers [id number]  female    breast feeding   Baby name: Charleen Kirks  S:  Reports feeling well.             Tolerating po/ No nausea or vomiting             Bleeding is light             Pain controlled with ibuprofen (OTC)             Up ad lib / ambulatory / voiding without difficulties        O:  A & O x 3, in no apparent distress              VS:  Vitals:   10/13/16 1200 10/13/16 1600 10/13/16 2357 10/14/16 0534  BP: 115/75 (!) 110/48 96/65 (!) 101/52  Pulse: (!) 113 89 86 (!) 118  Resp: 18  18 18   Temp: 98.2 F (36.8 C) 98.4 F (36.9 C) 97.6 F (36.4 C) 98.1 F (36.7 C)  TempSrc: Oral Oral Oral Oral  SpO2:   98%   Weight:      Height:        LABS:  Recent Labs  10/11/16 2239 10/14/16 0513  WBC 6.8 12.4*  HGB 11.9* 10.6*  HCT 34.5* 31.1*  PLT 283 242    Blood type: --/--/O POS, O POS (12/14 2239)  Rubella:   immune  I&O: I/O last 3 completed shifts: In: -  Out: 1200 [Urine:1000; Blood:200]          No intake/output data recorded.  Lungs: Clear and unlabored  Heart: regular rate and rhythm / no murmurs  Abdomen: soft, non-tender, non-distended             Fundus: firm, non-tender, U-1  Perineum: repair intact, minimal edema  Lochia: small  Extremities: trace pedal edema, no calf pain or tenderness    A/P: PPD # 1 30 y.o., [name]   Active Problems:   Postpartum care following vaginal delivery (12/16)   First degree perineal laceration during delivery   Doing well - stable status  Routine post partum orders  Anticipate discharge tomorrow    Juliene Pina, MSN, CNM 10/14/2016, 9:30 AM

## 2016-10-15 MED ORDER — COCONUT OIL OIL: 1.0000 "application " | TOPICAL_OIL | 0 refills | Status: DC | PRN

## 2016-10-15 MED ORDER — ACETAMINOPHEN 325 MG PO TABS: 650.0000 mg | ORAL_TABLET | ORAL | Status: DC | PRN

## 2016-10-15 MED ORDER — IBUPROFEN 600 MG PO TABS: 600.0000 mg | ORAL_TABLET | Freq: Four times a day (QID) | ORAL | 0 refills | Status: DC

## 2016-10-15 NOTE — Progress Notes (Signed)
Post Partum Day #2           Information for the patient's newborn:  Margi, Magos Girl Moorea [id number]  female  Baby name: Charleen Kirks Feeding: breast  Subjective: No HA, SOB, CP, F/C, breast symptoms. Pain minimal. Normal vaginal bleeding, no clots.      Objective:  VS:  Vitals:   10/13/16 2357 10/14/16 0534 10/14/16 1940 10/15/16 0637  BP: 96/65 (!) 101/52 108/74 109/76  Pulse: 86 (!) 118 97 96  Resp: 18 18 18 18   Temp: 97.6 F (36.4 C) 98.1 F (36.7 C) 98.2 F (36.8 C) 98 F (36.7 C)  TempSrc: Oral Oral Oral Oral  SpO2: 98%  99% 100%  Weight:      Height:        No intake or output data in the 24 hours ending 10/15/16 0810     Recent Labs  10/14/16 0513  WBC 12.4*  HGB 10.6*  HCT 31.1*  PLT 242    Blood type: --/--/O POS, O POS (12/14 2239) Rubella:      Physical Exam:  General: alert, cooperative and no distress Uterine Fundus: firm Lochia: appropriate Perineum: repair intact, edema none DVT Evaluation: No cords or calf tenderness. No significant calf/ankle edema.    Assessment/Plan: PPD # 2 / 30 y.o., [id number] S/P:induced vaginal   Active Problems:   Postpartum care following vaginal delivery (12/16)   First degree perineal laceration during delivery    normal postpartum exam  Continue current postpartum care  D/C home   LOS: 4 days   Juliene Pina, CNM, MSN 10/15/2016, 8:10 AM

## 2016-10-15 NOTE — Plan of Care (Signed)
Problem: Safety: Goal: Ability to remain free from injury will improve Outcome: Completed/Met Date Met: 10/15/16 Pt VSS. Able to ambulate without difficulty. Pain is managed well with motrin. Environment safe in room.   Problem: Tissue Perfusion: Goal: Risk factors for ineffective tissue perfusion will decrease Outcome: Completed/Met Date Met: 10/15/16 Pt ambulates regularly. No pain in lower legs, no redness or abnormal swelling.   Problem: Activity: Goal: Risk for activity intolerance will decrease Outcome: Completed/Met Date Met: 10/15/16 Patient able to move independently, rests when infant is sleeping or held by family.

## 2016-10-15 NOTE — Lactation Note (Signed)
This note was copied from a baby's chart. Lactation Consultation Note  Patient Name: Hayley Chambers [id number] Date: 10/15/2016 Reason for consult: Follow-up assessment  With this mom of a term baby, now 50 hours old. Mom is donig some breast feeding, and then supplementing with formula. Mom has a manual hand pump to take home. Supply and demand reviewed briefly. Mom knows to call for  Lactation as needed.    Maternal Data    Feeding Feeding Type: Bottle Fed - Formula Nipple Type: Slow - flow  LATCH Score/Interventions                      Lactation Tools Discussed/Used     Consult Status Consult Status: Complete    Hayley Chambers 10/15/2016, 9:16 AM

## 2016-10-15 NOTE — Discharge Summary (Signed)
Obstetric Discharge Summary Reason for Admission: induction of labor Prenatal Procedures: ultrasound Intrapartum Procedures: spontaneous vaginal delivery, GBS prophylaxis and epidural Postpartum Procedures: none Complications-Operative and Postpartum: 1st degree perineal laceration Hemoglobin  Date Value Ref Range Status  10/14/2016 10.6 (L) 12.0 - 15.0 g/dL Final   HCT  Date Value Ref Range Status  10/14/2016 31.1 (L) 36.0 - 46.0 % Final    Physical Exam:  General: alert, cooperative and no distress Lochia: appropriate Uterine Fundus: firm Incision: healing well DVT Evaluation: No cords or calf tenderness. No significant calf/ankle edema.  Discharge Diagnoses: Term Pregnancy-delivered  Discharge Information: Date: 10/15/2016 Activity: pelvic rest Diet: routine Medications: PNV and Ibuprofen Condition: stable Instructions: "Baby and Me" booklet Discharge to: home Follow-up [place], CNM. Schedule an appointment as soon as possible for a visit in 2 week(s).   Specialty:  Obstetrics and Gynecology Contact information: [address] [address] [phone]           Newborn Data: Live born female Charleen Kirks Birth Weight: 6 lb 10.4 oz (3016 g) APGAR: 9, 9  Home with mother.  Juliene Pina, CNM 10/15/2016, 8:22 AM

## 2016-10-15 NOTE — Progress Notes (Signed)
Tdap was offered to patient and she declined vaccination.

## 2017-03-18 ENCOUNTER — Emergency Department (HOSPITAL_COMMUNITY): Payer: 59

## 2017-03-18 ENCOUNTER — Emergency Department (HOSPITAL_COMMUNITY)
Admission: EM | Admit: 2017-03-18 | Discharge: 2017-03-18 | Disposition: A | Discharge status: Home / Self Care | Payer: 59 | Attending: Emergency Medicine | Admitting: Emergency Medicine

## 2017-03-18 ENCOUNTER — Encounter (HOSPITAL_COMMUNITY): Payer: Self-pay | Admitting: Emergency Medicine

## 2017-03-18 DIAGNOSIS — N939 Abnormal uterine and vaginal bleeding, unspecified: Secondary | ICD-10-CM | POA: Diagnosis not present

## 2017-03-18 DIAGNOSIS — Z79899 Other long term (current) drug therapy: Secondary | ICD-10-CM | POA: Diagnosis not present

## 2017-03-18 DIAGNOSIS — J45909 Unspecified asthma, uncomplicated: Secondary | ICD-10-CM | POA: Insufficient documentation

## 2017-03-18 DIAGNOSIS — R102 Pelvic and perineal pain: Secondary | ICD-10-CM | POA: Insufficient documentation

## 2017-03-18 LAB — CBC WITH DIFFERENTIAL/PLATELET
BASOS ABS: 0.1 10*3/uL (ref 0.0–0.1)
Basophils Relative: 1 %
EOS ABS: 0.2 10*3/uL (ref 0.0–0.7)
EOS PCT: 3 %
HCT: 40.7 % (ref 36.0–46.0)
Hemoglobin: 13 g/dL (ref 12.0–15.0)
Lymphocytes Relative: 30 %
Lymphs Abs: 1.5 10*3/uL (ref 0.7–4.0)
MCH: 23 pg — ABNORMAL LOW (ref 26.0–34.0)
MCHC: 31.9 g/dL (ref 30.0–36.0)
MCV: 72.2 fL — AB (ref 78.0–100.0)
MONO ABS: 0.4 10*3/uL (ref 0.1–1.0)
Monocytes Relative: 8 %
NEUTROS PCT: 58 %
Neutro Abs: 2.9 10*3/uL (ref 1.7–7.7)
Platelets: 300 10*3/uL (ref 150–400)
RBC: 5.64 MIL/uL — AB (ref 3.87–5.11)
RDW: 14.9 % (ref 11.5–15.5)
WBC: 5.1 10*3/uL (ref 4.0–10.5)

## 2017-03-18 LAB — BASIC METABOLIC PANEL
Anion gap: 7 (ref 5–15)
BUN: 9 mg/dL (ref 6–20)
CHLORIDE: 101 mmol/L (ref 101–111)
CO2: 27 mmol/L (ref 22–32)
CREATININE: 0.66 mg/dL (ref 0.44–1.00)
Calcium: 8.9 mg/dL (ref 8.9–10.3)
Glucose, Bld: 86 mg/dL (ref 65–99)
Potassium: 3.8 mmol/L (ref 3.5–5.1)
Sodium: 135 mmol/L (ref 135–145)

## 2017-03-18 LAB — URINALYSIS, ROUTINE W REFLEX MICROSCOPIC
BILIRUBIN URINE: NEGATIVE
Glucose, UA: NEGATIVE mg/dL
KETONES UR: NEGATIVE mg/dL
Nitrite: NEGATIVE
Protein, ur: 30 mg/dL — AB
SPECIFIC GRAVITY, URINE: 1.01 (ref 1.005–1.030)
pH: 6 (ref 5.0–8.0)

## 2017-03-18 LAB — WET PREP, GENITAL
Clue Cells Wet Prep HPF POC: NONE SEEN
SPERM: NONE SEEN
Trich, Wet Prep: NONE SEEN
Yeast Wet Prep HPF POC: NONE SEEN

## 2017-03-18 LAB — RAPID HIV SCREEN (HIV 1/2 AB+AG)
HIV 1/2 ANTIBODIES: NONREACTIVE
HIV-1 P24 ANTIGEN - HIV24: NONREACTIVE

## 2017-03-18 LAB — I-STAT BETA HCG BLOOD, ED (MC, WL, AP ONLY): I-stat hCG, quantitative: <5 m[IU]/mL (ref <5)

## 2017-03-18 MED ORDER — AZITHROMYCIN 250 MG PO TABS
1000.0000 mg | ORAL_TABLET | Freq: Once | ORAL | Status: AC
  Administered 2017-03-18: 1000 mg via ORAL
  Filled 2017-03-18: qty 4

## 2017-03-18 MED ORDER — CEFTRIAXONE SODIUM 250 MG IJ SOLR
250.0000 mg | Freq: Once | INTRAMUSCULAR | Status: AC
Start: 2017-03-18 — End: 2017-03-18
  Administered 2017-03-18: 250 mg via INTRAMUSCULAR
  Filled 2017-03-18: qty 250

## 2017-03-18 MED ORDER — IBUPROFEN 200 MG PO TABS
600.0000 mg | ORAL_TABLET | Freq: Once | ORAL | Status: AC
  Administered 2017-03-18: 600 mg via ORAL
  Filled 2017-03-18: qty 1

## 2017-03-18 NOTE — ED Provider Notes (Signed)
[facility] DEPT Provider Note   CSN: [id number] Arrival date & time: 03/18/17  1239     History   Chief Complaint Chief Complaint  Patient presents with  . Vaginal Bleeding  . Abdominal Pain    HPI Hayley Chambers is a 31 y.o. female who presents with severe abdominal pain associated with vaginal bleeding.  She reports that last night she began having bilateral lower abdominal cramping and light bleeding consistent with her menstrual cycle. She notes that the one menstrual cycle that she has had since delivery was heavier than her previous cycles and that this one is lighter than her last..    Today around 1130 she began experiencing sudden onset of sharp left lower quadrant abdominal pain.  Her pain does not radiate, it goes through "waves" where it is worse but it is always present.  Her pain is associated with nausea, and "goosebumps."  She took a pamprin this morning for her pain with out relief.  She reports that she has unprotected sex with her boyfriend with her last time one week ago.  She denies abnormal vaginal discharge, vaginal odors, urinary odor, frequency, or flank pain.  She was well before the onset of her pain.    She has a history of ectopic pregnancy in 2012 which required surgery.  She does not take any birth control.   HPI  Past Medical History:  Diagnosis Date  . Asthma 10/29/1997  . Complication of anesthesia   . Medical history non-contributory   . PONV (postoperative nausea and vomiting)     Patient Active Problem List   Diagnosis Date Noted  . Postpartum care following vaginal delivery (12/16) 10/13/2016  . First degree perineal laceration during delivery 10/13/2016    Past Surgical History:  Procedure Laterality Date  . ECTOPIC PREGNANCY SURGERY    . TONSILLECTOMY      OB History    Gravida Para Term Preterm AB Living   3 1 1   2 1    SAB TAB Ectopic Multiple Live Births   1   1 0 1       Home Medications    Prior to Admission  medications   Medication Sig Start Date End Date Taking? Authorizing Provider  acetaminophen (TYLENOL) 325 MG tablet Take 2 tablets (650 mg total) by mouth every 4 (four) hours as needed (for pain scale < 4). 10/15/16  Yes Juliene Pina, CNM  ibuprofen (ADVIL,MOTRIN) 600 MG tablet Take 1 tablet (600 mg total) by mouth every 6 (six) hours. Patient taking differently: Take 600 mg by mouth every 8 (eight) hours as needed for moderate pain.  10/15/16  Yes Derrell Lolling C, CNM  naproxen sodium (PAMPRIN ALL DAY RELIEF MAX ST) 220 MG tablet Take 220 mg by mouth 2 (two) times daily as needed (pain).   Yes [provider]  coconut oil OIL Apply 1 application topically as needed. Patient not taking: Reported on 03/18/2017 10/15/16   Juliene Pina, CNM  fluconazole (DIFLUCAN) 150 MG tablet Take 1 tablet (150 mg total) by mouth once. Pick up the refill and and take second dose in 5 days if symptoms have not resolved Patient not taking: Reported on 10/12/2016 01/05/14   Liam Graham, PA-C    Family History Family History  Problem Relation Age of Onset  . Alcohol abuse Mother   . Miscarriages / [place] Mother   . Birth defects Sister        cleft lip and palate  .  Mental illness Sister        bipolar  . Miscarriages / Stillbirths Sister   . Heart disease Maternal Grandmother   . Cancer Maternal Grandfather   . Heart disease Paternal Grandmother   . Arthritis Neg Hx   . Asthma Neg Hx   . COPD Neg Hx   . Depression Neg Hx   . Diabetes Neg Hx   . Drug abuse Neg Hx   . Early death Neg Hx   . Hearing loss Neg Hx   . Hyperlipidemia Neg Hx   . Hypertension Neg Hx   . Kidney disease Neg Hx   . Learning disabilities Neg Hx   . Mental retardation Neg Hx   . Stroke Neg Hx   . Vision loss Neg Hx   . Varicose Veins Neg Hx     Social History Social History  Substance Use Topics  . Smoking status: Never Smoker  . Smokeless tobacco: Never Used  . Alcohol use No     Allergies    Patient has no known allergies.   Review of Systems Review of Systems  Constitutional: Negative for chills, fatigue and fever.  HENT: Negative for facial swelling and sore throat.   Eyes: Negative for visual disturbance.  Respiratory: Negative for cough, chest tightness and shortness of breath.   Cardiovascular: Negative for chest pain.  Gastrointestinal: Positive for nausea. Negative for abdominal distention, abdominal pain, blood in stool, constipation, diarrhea and vomiting.  Genitourinary: Positive for menstrual problem, pelvic pain and vaginal bleeding. Negative for decreased urine volume, difficulty urinating, dysuria, flank pain, frequency, genital sores, hematuria, urgency and vaginal discharge.  Musculoskeletal: Negative for back pain, myalgias and neck pain.  Skin: Negative for color change and rash.  Neurological: Negative for weakness, light-headedness and headaches.  Hematological: Does not bruise/bleed easily.     Physical Exam Updated Vital Signs BP 103/65   Pulse 90   Temp 98.1 F (36.7 C) (Oral)   Resp 20   Ht 5' (1.524 m)   Wt 95.3 kg (210 lb)   LMP 02/18/2017 (Exact Date)   SpO2 100%   BMI 41.01 kg/m   Physical Exam  Constitutional: She appears well-developed and well-nourished. No distress.  HENT:  Head: Normocephalic and atraumatic.  Eyes: Conjunctivae are normal. Left eye exhibits no discharge. No scleral icterus.  Neck: Normal range of motion.  Cardiovascular: Normal rate and regular rhythm.   No murmur heard. Pulmonary/Chest: Effort normal and breath sounds normal. No stridor. No respiratory distress. She has no wheezes.  Abdominal: Soft. Normal appearance and bowel sounds are normal. She exhibits no distension. There is no tenderness. There is no rigidity, no rebound, no guarding, no tenderness at McBurney's point and negative Murphy's sign.  Physical exam of bilateral lower quadrants failed to re-create patients pain.  Patient stated her pain was  "lower" indicating it was deeper into the pelvis when abdomen palpated at the pelvic brim.    Genitourinary: Uterus normal. Pelvic exam was performed with patient prone. There is no rash on the right labia. There is no rash on the left labia. Cervix exhibits no motion tenderness and no discharge. Right adnexum displays no mass and no tenderness. Left adnexum displays tenderness. Left adnexum displays no mass. There is bleeding in the vagina. No foreign body in the vagina. No signs of injury around the vagina.  Genitourinary Comments: Copious blood in the vaginal vault.  Patient was tender bilateral adenexa but more on the left than the right.  Musculoskeletal: She exhibits no edema or deformity.  Neurological: She is alert. She exhibits normal muscle tone.  Skin: Skin is warm and dry. She is not diaphoretic.  Psychiatric: She has a normal mood and affect. Her behavior is normal.  Nursing note and vitals reviewed.   ED Treatments / Results  Labs (all labs ordered are listed, but only abnormal results are displayed) Labs Reviewed  WET PREP, GENITAL - Abnormal; Notable for the following:       Result Value   WBC, Wet Prep HPF POC MANY (*)    All other components within normal limits  CBC WITH DIFFERENTIAL/PLATELET - Abnormal; Notable for the following:    RBC 5.64 (*)    MCV 72.2 (*)    MCH 23.0 (*)    All other components within normal limits  URINALYSIS, ROUTINE W REFLEX MICROSCOPIC - Abnormal; Notable for the following:    APPearance HAZY (*)    Hgb urine dipstick LARGE (*)    Protein, ur 30 (*)    Leukocytes, UA MODERATE (*)    Bacteria, UA FEW (*)    Squamous Epithelial / LPF 0-5 (*)    All other components within normal limits  BASIC METABOLIC PANEL  RAPID HIV SCREEN (HIV 1/2 AB+AG)  HIV ANTIBODY (ROUTINE TESTING)  I-STAT BETA HCG BLOOD, ED ([facility], [facility], [facility] ONLY)  GC/CHLAMYDIA PROBE AMP ([place]) NOT AT [facility]    EKG  EKG Interpretation None       Radiology US  Transvaginal Non-ob  Result Date: 03/18/2017 CLINICAL DATA:  Pelvic pain.  Vaginal bleeding. EXAM: TRANSABDOMINAL AND TRANSVAGINAL ULTRASOUND OF PELVIS DOPPLER ULTRASOUND OF OVARIES TECHNIQUE: Both transabdominal and transvaginal ultrasound examinations of the pelvis were performed. Transabdominal technique was performed for global imaging of the pelvis including uterus, ovaries, adnexal regions, and pelvic cul-de-sac. It was necessary to proceed with endovaginal exam following the transabdominal exam to visualize the ovaries. Color and duplex Doppler ultrasound was utilized to evaluate blood flow to the ovaries. COMPARISON:  None. FINDINGS: Uterus Measurements: 9.6 x 4.6 x 4.1 cm. No fibroids or other mass visualized. Endometrium Thickness: Mildly thickened, 11 mm. No focal abnormality visualized. Right ovary Measurements: 2.2 x 1.0 x 1.1 cm. Normal appearance/no adnexal mass. Left ovary Measurements: 1.7 x 1.4 x 1.6 cm. Normal appearance/no adnexal mass. Pulsed Doppler evaluation of both ovaries demonstrates normal low-resistance arterial and venous waveforms. Other findings Trace free fluid. IMPRESSION: No evidence for ovarian torsion or adnexal mass. Mildly thickened endometrium, 11 mm. Trace free fluid. Electronically Signed   By: Staci Righter M.D.   On: 03/18/2017 18:52   US Pelvis Complete  Result Date: 03/18/2017 CLINICAL DATA:  Pelvic pain.  Vaginal bleeding. EXAM: TRANSABDOMINAL AND TRANSVAGINAL ULTRASOUND OF PELVIS DOPPLER ULTRASOUND OF OVARIES TECHNIQUE: Both transabdominal and transvaginal ultrasound examinations of the pelvis were performed. Transabdominal technique was performed for global imaging of the pelvis including uterus, ovaries, adnexal regions, and pelvic cul-de-sac. It was necessary to proceed with endovaginal exam following the transabdominal exam to visualize the ovaries. Color and duplex Doppler ultrasound was utilized to evaluate blood flow to the ovaries. COMPARISON:  None.  FINDINGS: Uterus Measurements: 9.6 x 4.6 x 4.1 cm. No fibroids or other mass visualized. Endometrium Thickness: Mildly thickened, 11 mm. No focal abnormality visualized. Right ovary Measurements: 2.2 x 1.0 x 1.1 cm. Normal appearance/no adnexal mass. Left ovary Measurements: 1.7 x 1.4 x 1.6 cm. Normal appearance/no adnexal mass. Pulsed Doppler evaluation of both ovaries demonstrates normal low-resistance arterial and  venous waveforms. Other findings Trace free fluid. IMPRESSION: No evidence for ovarian torsion or adnexal mass. Mildly thickened endometrium, 11 mm. Trace free fluid. Electronically Signed   By: Staci Righter M.D.   On: 03/18/2017 18:52   [place] Art/ven Flow Abd Pelv Doppler  Result Date: 03/18/2017 CLINICAL DATA:  Pelvic pain.  Vaginal bleeding. EXAM: TRANSABDOMINAL AND TRANSVAGINAL ULTRASOUND OF PELVIS DOPPLER ULTRASOUND OF OVARIES TECHNIQUE: Both transabdominal and transvaginal ultrasound examinations of the pelvis were performed. Transabdominal technique was performed for global imaging of the pelvis including uterus, ovaries, adnexal regions, and pelvic cul-de-sac. It was necessary to proceed with endovaginal exam following the transabdominal exam to visualize the ovaries. Color and duplex Doppler ultrasound was utilized to evaluate blood flow to the ovaries. COMPARISON:  None. FINDINGS: Uterus Measurements: 9.6 x 4.6 x 4.1 cm. No fibroids or other mass visualized. Endometrium Thickness: Mildly thickened, 11 mm. No focal abnormality visualized. Right ovary Measurements: 2.2 x 1.0 x 1.1 cm. Normal appearance/no adnexal mass. Left ovary Measurements: 1.7 x 1.4 x 1.6 cm. Normal appearance/no adnexal mass. Pulsed Doppler evaluation of both ovaries demonstrates normal low-resistance arterial and venous waveforms. Other findings Trace free fluid. IMPRESSION: No evidence for ovarian torsion or adnexal mass. Mildly thickened endometrium, 11 mm. Trace free fluid. Electronically Signed   By: Staci Righter  M.D.   On: 03/18/2017 18:52    Procedures Procedures (including critical care time)  Medications Ordered in ED Medications  cefTRIAXone (ROCEPHIN) injection 250 mg (not administered)  azithromycin (ZITHROMAX) tablet 1,000 mg (not administered)  ibuprofen (ADVIL,MOTRIN) tablet 600 mg (600 mg Oral Given 03/18/17 1634)     Initial Impression / Assessment and Plan / ED Course  I have reviewed the triage vital signs and the nursing notes.  Pertinent labs & imaging results that were available during my care of the patient were reviewed by me and considered in my medical decision making (see chart for details).  Clinical Course as of Mar 18 1926  Mon Mar 18, 2017  1630 Spoke with ultrasound to expedite scans  [EH]  1920 Patient updated on results, tests pending, plan for discharge  [EH]    Clinical Course User Index [EH] Lorin Glass, PA-C   Hayley Chambers presents with pelvic pain and vaginal bleeding.  She reports having unprotected intercourse with one female partner.  Pregnancy test was negative.  CBC without anemia, or leukocytosis.  Pelvic exam with left sided adnexal tenderness, no cervical motion tenderness. Based on exam and pain location low concern for intra-abdominal process.  Wet prep showed many WBCs. Based on lack of cervical motion tenderness suspect infection with gonorrhea/chlamydia without PID.  Will treat with azithromycin and rocephin. Patient understands she has cultures pending.     As patient reports that her first postpartum period was more painful than her menstrual cycles before pregnancy this may be her new normal menstrual cycle.  Ultrasound with Doppler obtained due to suspicion of torsion. No abnormalities noted other than a mildly thickened endometrium and trace pelvic free fluid.  Patient instructed to follow up with her GYN/nurse midwife regarding her pelvic pain and vaginal bleeding.     At this time there does not appear to be any evidence of an acute  emergency medical condition and the patient appears stable for discharge with appropriate outpatient follow up.Diagnosis was discussed with patient who verbalizes understanding and is agreeable to discharge. Pt case discussed with Dr. Kathrynn Humble who agrees with my plan.     Final Clinical  Impressions(s) / ED Diagnoses   Final diagnoses:  Pelvic pain  Vaginal bleeding    New Prescriptions New Prescriptions   No medications on file     Hayley Chambers 03/18/17 Sharmon Revere, MD 03/19/17 [phone]

## 2017-03-18 NOTE — Discharge Instructions (Signed)
You have test results for gonorrhea and chlamydia pending. No news is good news and you should hear back if your tests are positive by the end of the week.  You have been treated for both gonorrhea and chlamydia today with azithromycin and ceftriaxone.  Please follow-up with your OB/GYN or midwife regarding your symptoms today. Please obtain a primary care doctor (if you do not have one) and follow up with them.  Please take ibuprofen (Advil, Motrin) and acetaminophen (Tylenol)  to relieve your pain.  You may take up to 800 MG (4 pills) of normal strength ibuprofen every 8 hours as needed.  In between doses of ibuprofen you make take tylenol, up to 1,000 mg (two extra strength pills).  Do not take more than 3,000 mg tylenol in a 24 hour period.    Please do not hesitate to return if your pain becomes unbearable, you become lightheaded, dizzy, developed fevers, nausea/vomiting, or have any concerns.

## 2017-03-18 NOTE — ED Triage Notes (Signed)
Pt started having severe abd pain yesterday-- heavy bleeding started yesterday -- using 1 tampon every 2 hours, passing bright red clots--  Has had an ectopic pregnancy 2012--

## 2017-03-18 NOTE — ED Notes (Signed)
Patient transported to Ultrasound 

## 2017-03-18 NOTE — ED Notes (Signed)
Dr. Nanavati at bedside at this time.  

## 2017-03-18 NOTE — ED Notes (Addendum)
Pelvic exam completed by Benjamine Mola, [place]. RN chaperone.

## 2017-03-19 LAB — HIV ANTIBODY (ROUTINE TESTING W REFLEX): HIV Screen 4th Generation wRfx: NONREACTIVE

## 2017-03-19 LAB — GC/CHLAMYDIA PROBE AMP (~~LOC~~) NOT AT ARMC
Chlamydia: NEGATIVE
NEISSERIA GONORRHEA: NEGATIVE

## 2017-05-18 ENCOUNTER — Encounter (HOSPITAL_COMMUNITY): Payer: Self-pay | Admitting: *Deleted

## 2017-05-18 ENCOUNTER — Ambulatory Visit (HOSPITAL_COMMUNITY)
Admission: EM | Admit: 2017-05-18 | Discharge: 2017-05-18 | Disposition: A | Discharge status: Home / Self Care | Payer: 59 | Attending: Family Medicine | Admitting: Family Medicine

## 2017-05-18 DIAGNOSIS — J020 Streptococcal pharyngitis: Secondary | ICD-10-CM | POA: Diagnosis not present

## 2017-05-18 LAB — POCT RAPID STREP A: Streptococcus, Group A Screen (Direct): POSITIVE — AB

## 2017-05-18 MED ORDER — LIDOCAINE VISCOUS 2 % MT SOLN: OROMUCOSAL | 0 refills | Status: DC

## 2017-05-18 MED ORDER — AMOXICILLIN 875 MG PO TABS: 875.0000 mg | ORAL_TABLET | Freq: Two times a day (BID) | ORAL | 0 refills | Status: DC

## 2017-05-18 NOTE — ED Provider Notes (Signed)
CSN: [id number]     Arrival date & time 05/18/17  1604 History   None    Chief Complaint  Patient presents with  . Sore Throat   (Consider location/radiation/quality/duration/timing/severity/associated sxs/prior Treatment) Patient c/o sore throat for 1 day.  She states she has hx of strep throat.    The history is provided by the patient.  Sore Throat  This is a new problem. The problem occurs constantly. The problem has not changed since onset.Nothing aggravates the symptoms. Nothing relieves the symptoms.    Past Medical History:  Diagnosis Date  . Asthma 10/29/1997  . Complication of anesthesia   . Medical history non-contributory   . PONV (postoperative nausea and vomiting)    Past Surgical History:  Procedure Laterality Date  . ECTOPIC PREGNANCY SURGERY    . TONSILLECTOMY     Family History  Problem Relation Age of Onset  . Alcohol abuse Mother   . Miscarriages / [place] Mother   . Birth defects Sister        cleft lip and palate  . Mental illness Sister        bipolar  . Miscarriages / Stillbirths Sister   . Heart disease Maternal Grandmother   . Cancer Maternal Grandfather   . Heart disease Paternal Grandmother   . Arthritis Neg Hx   . Asthma Neg Hx   . COPD Neg Hx   . Depression Neg Hx   . Diabetes Neg Hx   . Drug abuse Neg Hx   . Early death Neg Hx   . Hearing loss Neg Hx   . Hyperlipidemia Neg Hx   . Hypertension Neg Hx   . Kidney disease Neg Hx   . Learning disabilities Neg Hx   . Mental retardation Neg Hx   . Stroke Neg Hx   . Vision loss Neg Hx   . Varicose Veins Neg Hx    Social History  Substance Use Topics  . Smoking status: Never Smoker  . Smokeless tobacco: Never Used  . Alcohol use No   OB History    Gravida Para Term Preterm AB Living   3 1 1   2 1    SAB TAB Ectopic Multiple Live Births   1   1 0 1     Review of Systems  Constitutional: Negative.   HENT: Positive for sore throat.   Eyes: Negative.   Respiratory:  Negative.   Cardiovascular: Negative.   Gastrointestinal: Negative.   Endocrine: Negative.   Genitourinary: Negative.   Musculoskeletal: Negative.   Allergic/Immunologic: Negative.   Hematological: Negative.   Psychiatric/Behavioral: Negative.     Allergies  Patient has no known allergies.  Home Medications   Prior to Admission medications   Medication Sig Start Date End Date Taking? Authorizing Provider  acetaminophen (TYLENOL) 325 MG tablet Take 2 tablets (650 mg total) by mouth every 4 (four) hours as needed (for pain scale < 4). 10/15/16   Juliene Pina, CNM  amoxicillin (AMOXIL) 875 MG tablet Take 1 tablet (875 mg total) by mouth 2 (two) times daily. 05/18/17   Lysbeth Penner, FNP  coconut oil OIL Apply 1 application topically as needed. Patient not taking: Reported on 03/18/2017 10/15/16   Juliene Pina, CNM  fluconazole (DIFLUCAN) 150 MG tablet Take 1 tablet (150 mg total) by mouth once. Pick up the refill and and take second dose in 5 days if symptoms have not resolved Patient not taking: Reported on 10/12/2016 01/05/14   Luana Shu,  Freeman Caldron, PA-C  ibuprofen (ADVIL,MOTRIN) 600 MG tablet Take 1 tablet (600 mg total) by mouth every 6 (six) hours. Patient taking differently: Take 600 mg by mouth every 8 (eight) hours as needed for moderate pain.  10/15/16   Juliene Pina, CNM  lidocaine (XYLOCAINE) 2 % solution Take 10 ml po q 3 hours and gargle prn throat pain 05/18/17   Lysbeth Penner, FNP  naproxen sodium (PAMPRIN ALL DAY RELIEF MAX ST) 220 MG tablet Take 220 mg by mouth 2 (two) times daily as needed (pain).    [provider]   Meds Ordered and Administered this Visit  Medications - No data to display  BP 130/70   Pulse (!) 108   Temp 98.2 F (36.8 C) (Oral)   Resp 14   LMP 04/12/2017   SpO2 (!) 10%   Breastfeeding? No  No data found.   Physical Exam  Constitutional: She appears well-developed and well-nourished.  HENT:  Head: Normocephalic and  atraumatic.  Right Ear: External ear normal.  Left Ear: External ear normal.  OPX erythematous   Eyes: Pupils are equal, round, and reactive to light. Conjunctivae and EOM are normal.  Neck: Normal range of motion. Neck supple.  Cardiovascular: Normal rate, regular rhythm and normal heart sounds.   Pulmonary/Chest: Effort normal and breath sounds normal.  Abdominal: Soft. Bowel sounds are normal.  Lymphadenopathy:    She has cervical adenopathy.  Nursing note and vitals reviewed.   Urgent Care Course     Procedures (including critical care time)  Labs Review Labs Reviewed  POCT RAPID STREP A - Abnormal; Notable for the following:       Result Value   Streptococcus, Group A Screen (Direct) POSITIVE (*)    All other components within normal limits    Imaging Review No results found.   Visual Acuity Review  Right Eye Distance:   Left Eye Distance:   Bilateral Distance:    Right Eye Near:   Left Eye Near:    Bilateral Near:         MDM   1. Streptococcal sore throat    Amoxicillin 875mg  one po bid x 10 days #20 Lidocaine solution 2% 10 ml po q 3 hours prn #175ml  Push po fluids, rest, tylenol and motrin otc prn as directed for fever, arthralgias, and myalgias.  Follow up prn if sx's continue or persist.    Lysbeth Penner, FNP 05/18/17 1736

## 2017-05-18 NOTE — ED Triage Notes (Addendum)
Pt  Reports   Symptoms  Of  sorethroat   As   Well  As  Body  Aches   With onset  Of   Symptoms     X   2  Days      Pt  Reports    Some  Congestion    As   Well   Pt

## 2017-07-29 ENCOUNTER — Other Ambulatory Visit (HOSPITAL_COMMUNITY): Payer: Self-pay

## 2017-07-29 DIAGNOSIS — Z3A18 18 weeks gestation of pregnancy: Secondary | ICD-10-CM

## 2017-07-29 DIAGNOSIS — O99212 Obesity complicating pregnancy, second trimester: Secondary | ICD-10-CM

## 2017-07-29 DIAGNOSIS — Z3689 Encounter for other specified antenatal screening: Secondary | ICD-10-CM

## 2017-08-08 ENCOUNTER — Encounter (HOSPITAL_COMMUNITY): Payer: Self-pay

## 2017-08-15 ENCOUNTER — Encounter (HOSPITAL_COMMUNITY): Payer: Self-pay | Admitting: *Deleted

## 2017-08-16 ENCOUNTER — Encounter (HOSPITAL_COMMUNITY): Payer: Self-pay

## 2017-08-16 ENCOUNTER — Ambulatory Visit (HOSPITAL_COMMUNITY)
Admission: RE | Admit: 2017-08-16 | Discharge: 2017-08-16 | Disposition: A | Discharge status: Home / Self Care | Payer: 59 | Source: Ambulatory Visit

## 2017-08-16 DIAGNOSIS — O99212 Obesity complicating pregnancy, second trimester: Secondary | ICD-10-CM

## 2017-08-16 DIAGNOSIS — Z3689 Encounter for other specified antenatal screening: Secondary | ICD-10-CM

## 2017-08-16 DIAGNOSIS — Z3A18 18 weeks gestation of pregnancy: Secondary | ICD-10-CM | POA: Diagnosis not present

## 2017-08-16 DIAGNOSIS — O09522 Supervision of elderly multigravida, second trimester: Secondary | ICD-10-CM | POA: Insufficient documentation

## 2017-08-16 HISTORY — DX: Anemia, unspecified: D64.9

## 2017-08-16 NOTE — Addendum Note (Signed)
Encounter addended by: Marko Stai, RDMS on: 08/16/2017  4:01 PM<BR>    Actions taken: Imaging Exam ended

## 2017-09-16 IMAGING — US US TRANSVAGINAL NON-OB
1 series · 13 of 25 positions shown · non-contrast
Comparison: None.

CLINICAL DATA: Pelvic pain.  Vaginal bleeding.

EXAM:
TRANSABDOMINAL AND TRANSVAGINAL ULTRASOUND OF PELVIS
DOPPLER ULTRASOUND OF OVARIES
TECHNIQUE: Both transabdominal and transvaginal ultrasound examinations of the
pelvis were performed. Transabdominal technique was performed for
global imaging of the pelvis including uterus, ovaries, adnexal
regions, and pelvic cul-de-sac.
It was necessary to proceed with endovaginal exam following the
transabdominal exam to visualize the ovaries. Color and duplex
Doppler ultrasound was utilized to evaluate blood flow to the
ovaries.

[Series 1: us transvaginal non-ob · 0.24mm/px · 13 of 90 slices shown]
[im 1/90]
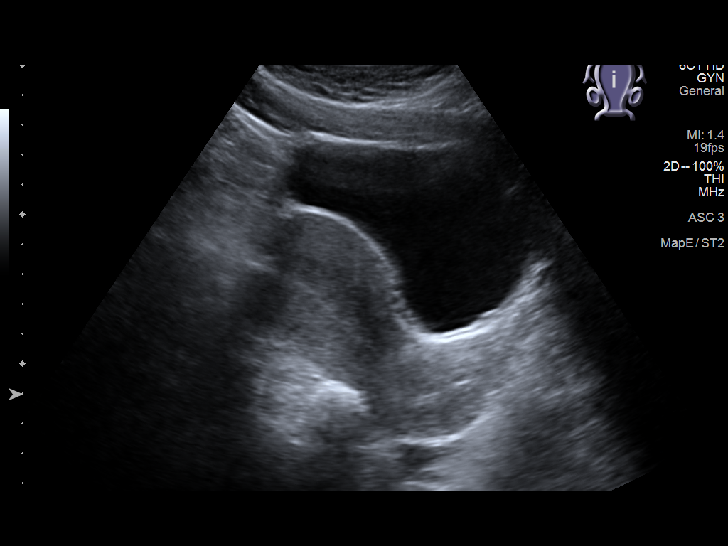
[im 8/90]
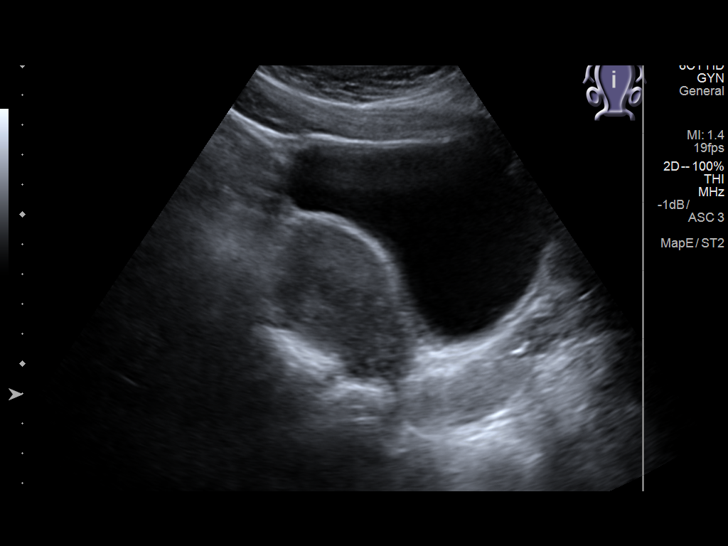
[im 15/90]
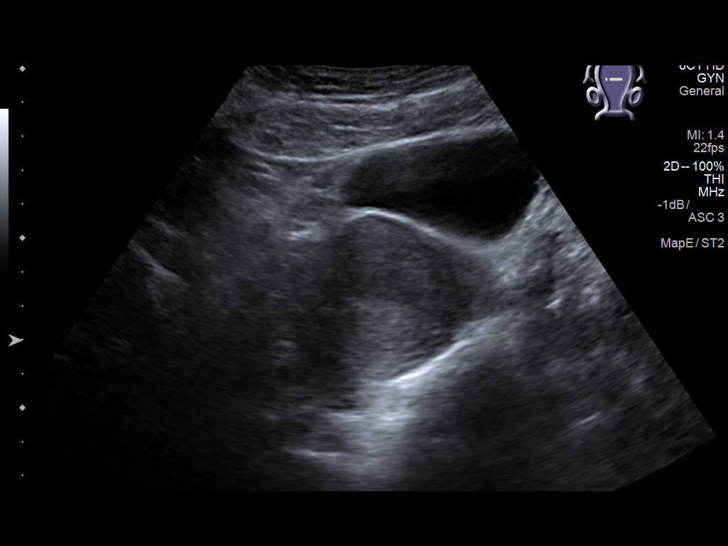
[im 23/90]
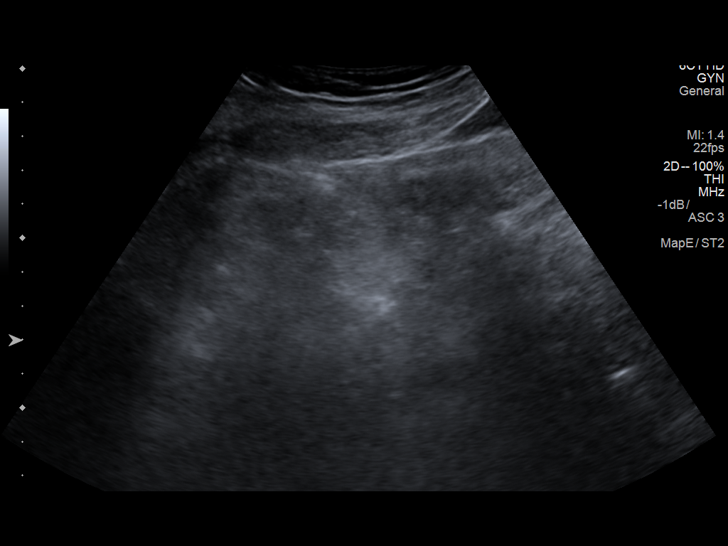
[im 30/90]
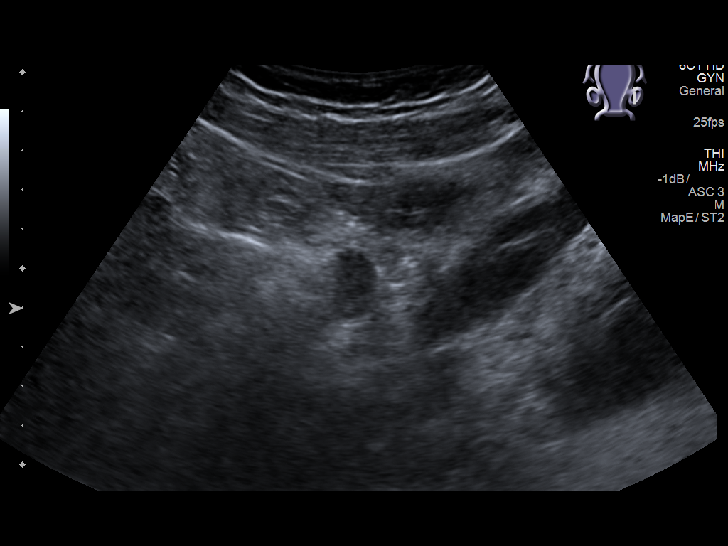
[im 38/90]
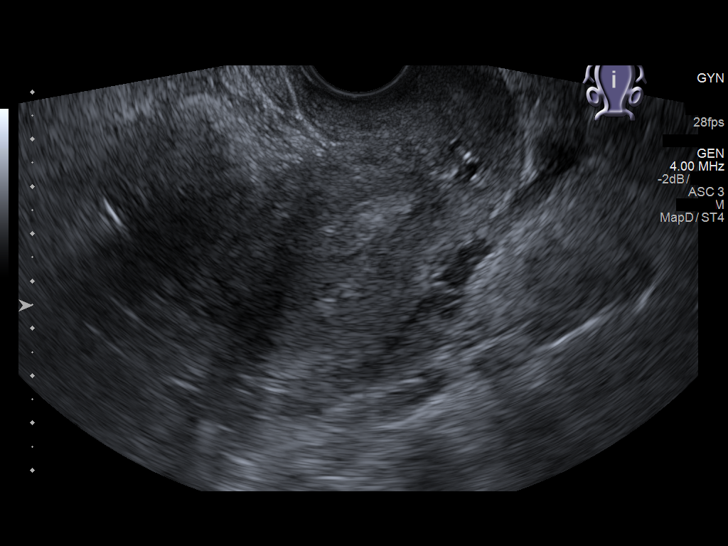
[im 45/90]
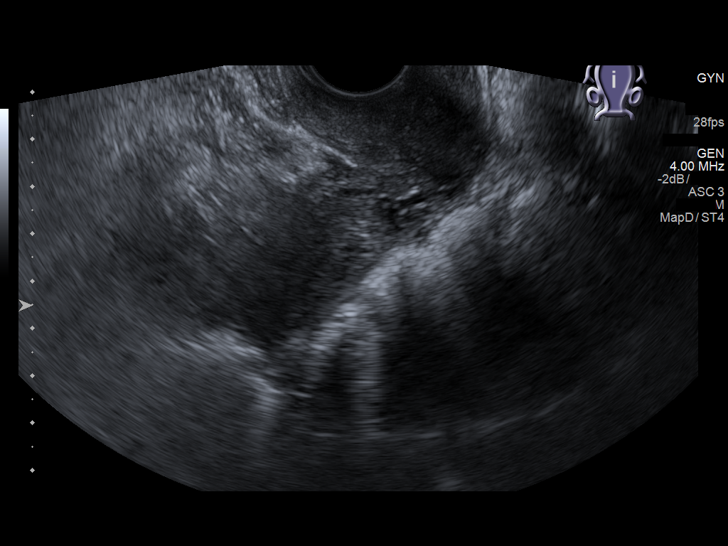
[im 52/90]
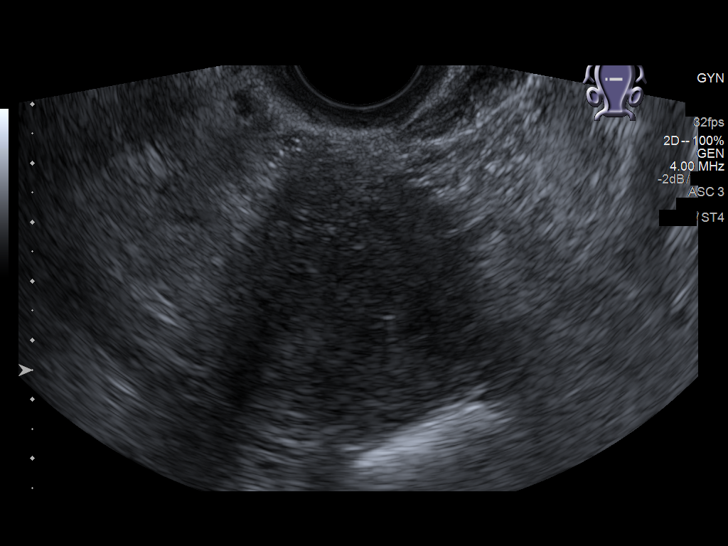
[im 60/90]
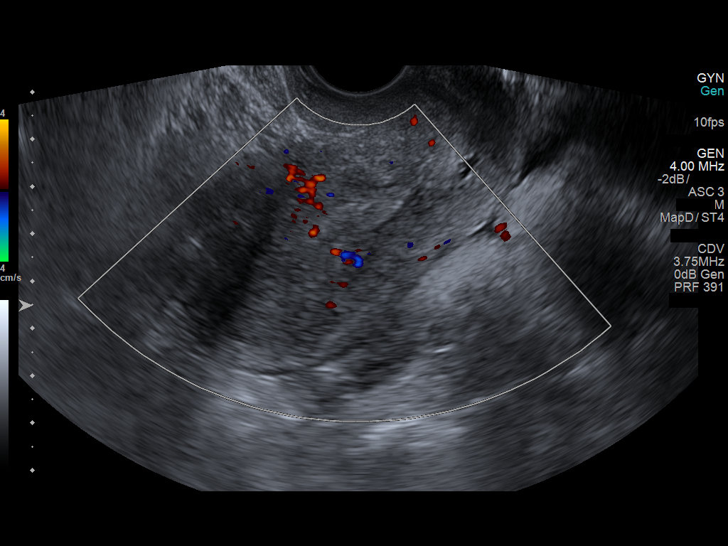
[im 67/90]
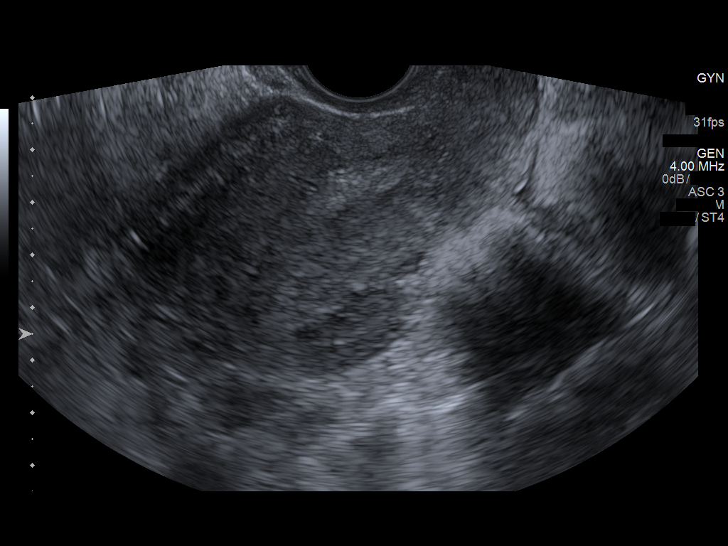
[im 75/90]
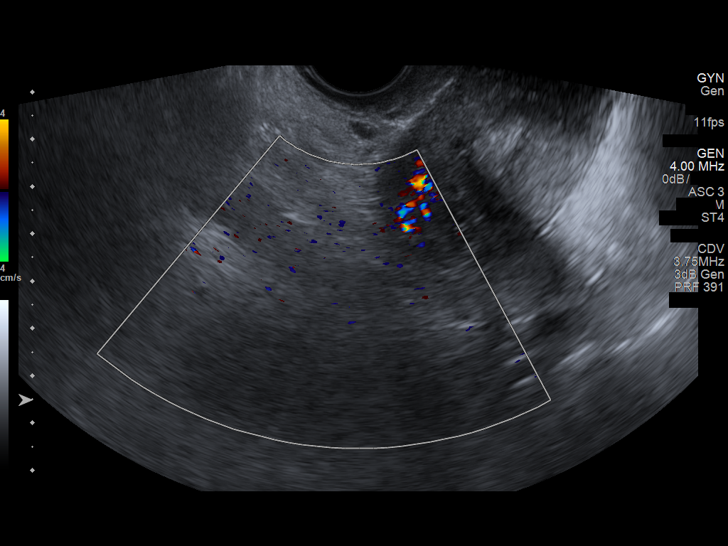
[im 82/90]
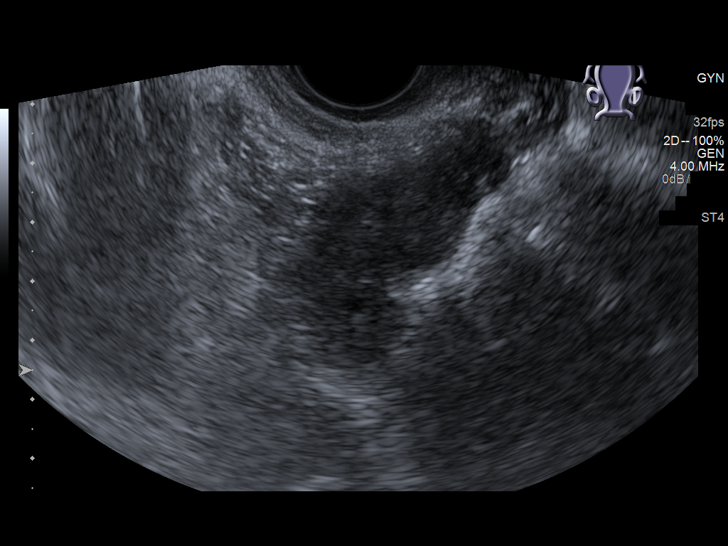
[im 90/90]
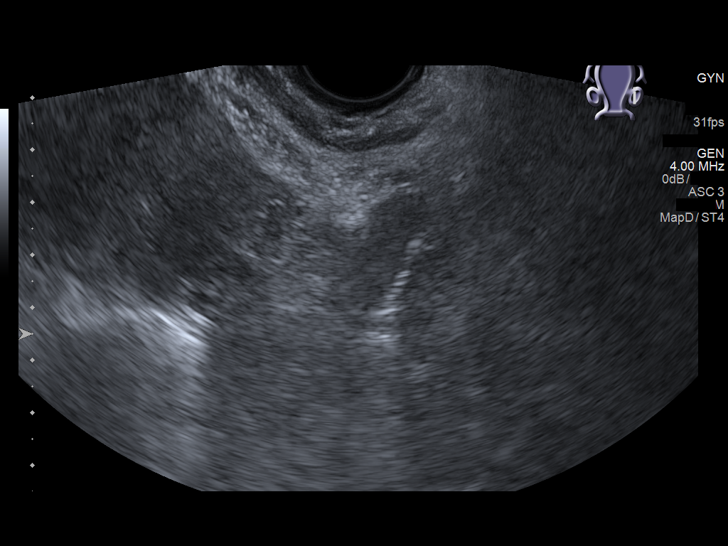

[13 of 25 positions shown; findings below may reference images not displayed]

FINDINGS: Uterus

Measurements: 9.6 x 4.6 x 4.1 cm. No fibroids or other mass
visualized.

Endometrium

Thickness: Mildly thickened, 11 mm. No focal abnormality visualized.

Right ovary

Measurements: 2.2 x 1.0 x 1.1 cm. Normal appearance/no adnexal mass.

Left ovary

Measurements: 1.7 x 1.4 x 1.6 cm. Normal appearance/no adnexal mass.

Pulsed Doppler evaluation of both ovaries demonstrates normal
low-resistance arterial and venous waveforms.

Other findings

Trace free fluid.
IMPRESSION: No evidence for ovarian torsion or adnexal mass.

Mildly thickened endometrium, 11 mm.

Trace free fluid.

## 2017-11-22 ENCOUNTER — Other Ambulatory Visit (HOSPITAL_COMMUNITY): Payer: Self-pay

## 2017-11-22 DIAGNOSIS — Z3A32 32 weeks gestation of pregnancy: Secondary | ICD-10-CM

## 2017-11-22 DIAGNOSIS — O99213 Obesity complicating pregnancy, third trimester: Secondary | ICD-10-CM

## 2017-11-22 DIAGNOSIS — IMO0002 Reserved for concepts with insufficient information to code with codable children: Secondary | ICD-10-CM

## 2017-11-22 DIAGNOSIS — Z0489 Encounter for examination and observation for other specified reasons: Secondary | ICD-10-CM

## 2017-11-25 ENCOUNTER — Ambulatory Visit (HOSPITAL_COMMUNITY)
Admission: RE | Admit: 2017-11-25 | Discharge: 2017-11-25 | Disposition: A | Discharge status: Home / Self Care | Payer: 59 | Source: Ambulatory Visit

## 2017-11-25 DIAGNOSIS — Z3A32 32 weeks gestation of pregnancy: Secondary | ICD-10-CM | POA: Diagnosis not present

## 2017-11-25 DIAGNOSIS — O99213 Obesity complicating pregnancy, third trimester: Secondary | ICD-10-CM | POA: Diagnosis present

## 2017-11-25 DIAGNOSIS — IMO0002 Reserved for concepts with insufficient information to code with codable children: Secondary | ICD-10-CM

## 2017-11-25 DIAGNOSIS — O09893 Supervision of other high risk pregnancies, third trimester: Secondary | ICD-10-CM | POA: Diagnosis not present

## 2017-11-25 DIAGNOSIS — Z362 Encounter for other antenatal screening follow-up: Secondary | ICD-10-CM | POA: Diagnosis present

## 2017-11-25 DIAGNOSIS — Z0489 Encounter for examination and observation for other specified reasons: Secondary | ICD-10-CM

## 2017-12-10 ENCOUNTER — Inpatient Hospital Stay (HOSPITAL_COMMUNITY)
Admission: AD | Admit: 2017-12-10 | Discharge: 2017-12-10 | Disposition: A | Discharge status: Home / Self Care | Payer: 59 | Source: Ambulatory Visit | Attending: Obstetrics and Gynecology | Admitting: Obstetrics and Gynecology

## 2017-12-10 ENCOUNTER — Encounter (HOSPITAL_COMMUNITY): Payer: Self-pay | Admitting: *Deleted

## 2017-12-10 DIAGNOSIS — Z791 Long term (current) use of non-steroidal anti-inflammatories (NSAID): Secondary | ICD-10-CM | POA: Insufficient documentation

## 2017-12-10 DIAGNOSIS — R000 Tachycardia, unspecified: Secondary | ICD-10-CM | POA: Insufficient documentation

## 2017-12-10 DIAGNOSIS — R002 Palpitations: Secondary | ICD-10-CM | POA: Diagnosis not present

## 2017-12-10 DIAGNOSIS — Z3490 Encounter for supervision of normal pregnancy, unspecified, unspecified trimester: Secondary | ICD-10-CM

## 2017-12-10 DIAGNOSIS — Z9889 Other specified postprocedural states: Secondary | ICD-10-CM | POA: Insufficient documentation

## 2017-12-10 DIAGNOSIS — Z809 Family history of malignant neoplasm, unspecified: Secondary | ICD-10-CM | POA: Diagnosis not present

## 2017-12-10 DIAGNOSIS — O99513 Diseases of the respiratory system complicating pregnancy, third trimester: Secondary | ICD-10-CM | POA: Insufficient documentation

## 2017-12-10 DIAGNOSIS — R079 Chest pain, unspecified: Secondary | ICD-10-CM | POA: Insufficient documentation

## 2017-12-10 DIAGNOSIS — J45909 Unspecified asthma, uncomplicated: Secondary | ICD-10-CM | POA: Insufficient documentation

## 2017-12-10 DIAGNOSIS — O99343 Other mental disorders complicating pregnancy, third trimester: Secondary | ICD-10-CM | POA: Diagnosis not present

## 2017-12-10 DIAGNOSIS — Z79899 Other long term (current) drug therapy: Secondary | ICD-10-CM | POA: Diagnosis not present

## 2017-12-10 DIAGNOSIS — F419 Anxiety disorder, unspecified: Secondary | ICD-10-CM | POA: Insufficient documentation

## 2017-12-10 DIAGNOSIS — O26893 Other specified pregnancy related conditions, third trimester: Secondary | ICD-10-CM | POA: Insufficient documentation

## 2017-12-10 DIAGNOSIS — Z811 Family history of alcohol abuse and dependence: Secondary | ICD-10-CM | POA: Diagnosis not present

## 2017-12-10 DIAGNOSIS — Z3A34 34 weeks gestation of pregnancy: Secondary | ICD-10-CM | POA: Diagnosis not present

## 2017-12-10 DIAGNOSIS — Z8249 Family history of ischemic heart disease and other diseases of the circulatory system: Secondary | ICD-10-CM | POA: Diagnosis not present

## 2017-12-10 DIAGNOSIS — Z818 Family history of other mental and behavioral disorders: Secondary | ICD-10-CM | POA: Insufficient documentation

## 2017-12-10 DIAGNOSIS — D573 Sickle-cell trait: Secondary | ICD-10-CM | POA: Diagnosis not present

## 2017-12-10 HISTORY — DX: Sickle-cell trait: D57.3

## 2017-12-10 HISTORY — DX: Tachycardia, unspecified: R00.0

## 2017-12-10 LAB — URINALYSIS, ROUTINE W REFLEX MICROSCOPIC
Bilirubin Urine: NEGATIVE
Glucose, UA: NEGATIVE mg/dL
Hgb urine dipstick: NEGATIVE
KETONES UR: NEGATIVE mg/dL
Nitrite: NEGATIVE
PROTEIN: NEGATIVE mg/dL
Specific Gravity, Urine: 1.014 (ref 1.005–1.030)
pH: 6 (ref 5.0–8.0)

## 2017-12-10 LAB — COMPREHENSIVE METABOLIC PANEL
ALK PHOS: 115 U/L (ref 38–126)
ALT: 11 U/L — ABNORMAL LOW (ref 14–54)
ANION GAP: 10 (ref 5–15)
AST: 18 U/L (ref 15–41)
Albumin: 2.7 g/dL — ABNORMAL LOW (ref 3.5–5.0)
BUN: 7 mg/dL (ref 6–20)
CO2: 21 mmol/L — AB (ref 22–32)
Calcium: 8.8 mg/dL — ABNORMAL LOW (ref 8.9–10.3)
Chloride: 103 mmol/L (ref 101–111)
Creatinine, Ser: 0.53 mg/dL (ref 0.44–1.00)
GFR calc Af Amer: >60 mL/min (ref >60)
GFR calc non Af Amer: >60 mL/min (ref >60)
GLUCOSE: 73 mg/dL (ref 65–99)
POTASSIUM: 3.8 mmol/L (ref 3.5–5.1)
SODIUM: 134 mmol/L — AB (ref 135–145)
Total Bilirubin: 0.3 mg/dL (ref 0.3–1.2)
Total Protein: 6.8 g/dL (ref 6.5–8.1)

## 2017-12-10 LAB — CBC
HCT: 32.3 % — ABNORMAL LOW (ref 36.0–46.0)
HEMOGLOBIN: 10.9 g/dL — AB (ref 12.0–15.0)
MCH: 24.2 pg — ABNORMAL LOW (ref 26.0–34.0)
MCHC: 33.7 g/dL (ref 30.0–36.0)
MCV: 71.6 fL — ABNORMAL LOW (ref 78.0–100.0)
Platelets: 340 10*3/uL (ref 150–400)
RBC: 4.51 MIL/uL (ref 3.87–5.11)
RDW: 15.4 % (ref 11.5–15.5)
WBC: 8.3 10*3/uL (ref 4.0–10.5)

## 2017-12-10 MED ORDER — LACTATED RINGERS IV BOLUS (SEPSIS)
1000.0000 mL | Freq: Once | INTRAVENOUS | Status: AC
  Administered 2017-12-10: 1000 mL via INTRAVENOUS

## 2017-12-10 MED ORDER — BUSPIRONE HCL 7.5 MG PO TABS: 7.5000 mg | ORAL_TABLET | Freq: Three times a day (TID) | ORAL | 1 refills | Status: DC

## 2017-12-10 NOTE — MAU Note (Addendum)
Pt states her HR has been high @ OB office, went to cardiologist last week, said she was tachycardic, had echocardiogram - doesn't know results.  Has been feeling heart race, had episode of tachycardia last Thursday.  Chest started hurting this morning @ work, HR was 134.  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  Also C/O SOB, speaking without difficulty.

## 2017-12-10 NOTE — MAU Note (Signed)
EKG in process

## 2017-12-10 NOTE — MAU Provider Note (Signed)
History     CSN: [id number]  Arrival date and time: 12/10/17 1126   First Provider Initiated Contact with Patient 12/10/17 1301      Chief Complaint  Patient presents with  . Tachycardia  . Chest Pain   G4P1 at [redacted]w[redacted]d. Previous NSVB uncomplicated. Patient presents to MAU with c/o chest pain, palpitations, and SOB while at work today, feels normal at present. Had EKG done at work which showed sinus tachy 134 HR, and was sent to MAU for eval.  Patient with known hx of tachy for past few weeks, has been seen by Dr. Woody Seller at [facility]. Normal cardio w/u, TFT's wnl - TSH 1.310, TT4 11.3, TT3 12.0. Patient is receiving regular PNC at [facility]. 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 Her partner has been supportive.  Patient has been instructed to follow a low glycemis index diet, increased protein with every meal, and avoid all caffeine. She reports has been compliant.     OB History    Gravida Para Term Preterm AB Living   4 1 1   2 1    SAB TAB Ectopic Multiple Live Births   1   1 0 1      Past Medical History:  Diagnosis Date  . Anemia   . Asthma 10/29/1997  . Complication of anesthesia   . Medical history non-contributory   . PONV (postoperative nausea and vomiting)   . Sickle cell trait ([place])   . Tachycardia     Past Surgical History:  Procedure Laterality Date  . DILATION AND CURETTAGE OF UTERUS    . ECTOPIC PREGNANCY SURGERY    . TONSILLECTOMY      Family History  Problem Relation Age of Onset  . Alcohol abuse Mother   . Miscarriages / [place] Mother   . Birth defects Sister        cleft lip and palate  . Mental illness Sister        bipolar  . Miscarriages / Stillbirths Sister   . Heart disease Maternal Grandmother   . Cancer Maternal  Grandfather   . Heart disease Paternal Grandmother   . Arthritis Neg Hx   . Asthma Neg Hx   . COPD Neg Hx   . Depression Neg Hx   . Diabetes Neg Hx   . Drug abuse Neg Hx   . Early death Neg Hx   . Hearing loss Neg Hx   . Hyperlipidemia Neg Hx   . Hypertension Neg Hx   . Kidney disease Neg Hx   . Learning disabilities Neg Hx   . Mental retardation Neg Hx   . Stroke Neg Hx   . Vision loss Neg Hx   . Varicose Veins Neg Hx     Social History   Tobacco Use  . Smoking status: Never Smoker  . Smokeless tobacco: Never Used  Substance Use Topics  . Alcohol use: No  . Drug use: No    Allergies: No Known Allergies  Medications Prior to Admission  Medication Sig Dispense Refill Last Dose  . acetaminophen (TYLENOL) 325 MG tablet Take 2 tablets (650 mg total) by mouth every 4 (four) hours as needed (for pain scale < 4).   Taking  . amoxicillin (AMOXIL) 875 MG tablet Take 1 tablet (875 mg total) by  mouth 2 (two) times daily. (Patient not taking: Reported on 08/16/2017) 20 tablet 0 Not Taking  . coconut oil OIL Apply 1 application topically as needed. (Patient not taking: Reported on 03/18/2017)  0 Not Taking at Unknown time  . fluconazole (DIFLUCAN) 150 MG tablet Take 1 tablet (150 mg total) by mouth once. Pick up the refill and and take second dose in 5 days if symptoms have not resolved (Patient not taking: Reported on 10/12/2016) 1 tablet 1 Completed Course at Unknown time  . ibuprofen (ADVIL,MOTRIN) 600 MG tablet Take 1 tablet (600 mg total) by mouth every 6 (six) hours. (Patient taking differently: Take 600 mg by mouth every 8 (eight) hours as needed for moderate pain. ) 30 tablet 0 Past Month at Unknown time  . lidocaine (XYLOCAINE) 2 % solution Take 10 ml po q 3 hours and gargle prn throat pain (Patient not taking: Reported on 08/16/2017) 120 mL 0 Not Taking  . naproxen sodium (PAMPRIN ALL DAY RELIEF MAX ST) 220 MG tablet Take 220 mg by mouth 2 (two) times daily as needed (pain).   Not  Taking  . Prenatal Vit w/Fe-Methylfol-FA (PNV PO) Take by mouth.   Taking  . Probiotic Product (PROBIOTIC PO) Take by mouth.   Taking    Review of Systems  Constitutional: Negative.   Respiratory: Negative for shortness of breath.   Cardiovascular: Negative for chest pain, palpitations and leg swelling.  Genitourinary: Negative for vaginal bleeding.       + FM    Physical Exam   Blood pressure 108/64, pulse (!) 108, temperature 98.2 F (36.8 C), temperature source Oral, resp. rate 18, height 5' (1.524 m), weight 100.2 kg (221 lb), last menstrual period 04/12/2017, SpO2 100 %, not currently breastfeeding.  Physical Exam  Nursing note and vitals reviewed. Constitutional: She is oriented to person, place, and time. She appears well-developed. No distress.  Cardiovascular: Regular rhythm. Exam reveals no gallop.  No murmur heard. Mild tachy  Respiratory: Effort normal and breath sounds normal.  GI: Soft. She exhibits no distension.  gravid  Musculoskeletal: Normal range of motion.  Neurological: She is alert and oriented to person, place, and time.  Psychiatric: She has a normal mood and affect.   FHT 140, mod var, + accels, no decels Toco: no ctx  MAU Course  Procedures  MDM EKG sinus tachy  CBC    Component Value Date/Time   WBC 8.3 12/10/2017 1239   RBC 4.51 12/10/2017 1239   HGB 10.9 (L) 12/10/2017 1239   HCT 32.3 (L) 12/10/2017 1239   PLT 340 12/10/2017 1239   MCV 71.6 (L) 12/10/2017 1239   MCH 24.2 (L) 12/10/2017 1239   MCHC 33.7 12/10/2017 1239   RDW 15.4 12/10/2017 1239   LYMPHSABS 1.5 03/18/2017 1541   MONOABS 0.4 03/18/2017 1541   EOSABS 0.2 03/18/2017 1541   BASOSABS 0.1 03/18/2017 1541   CMP     Component Value Date/Time   NA 134 (L) 12/10/2017 1239   K 3.8 12/10/2017 1239   CL 103 12/10/2017 1239   CO2 21 (L) 12/10/2017 1239   GLUCOSE 73 12/10/2017 1239   BUN 7 12/10/2017 1239   CREATININE 0.53 12/10/2017 1239   CALCIUM 8.8 (L) 12/10/2017  1239   PROT 6.8 12/10/2017 1239   ALBUMIN 2.7 (L) 12/10/2017 1239   AST 18 12/10/2017 1239   ALT 11 (L) 12/10/2017 1239   ALKPHOS 115 12/10/2017 1239   BILITOT 0.3 12/10/2017 1239   GFRNONAA >60  12/10/2017 1239   GFRAA >60 12/10/2017 1239   Urinalysis    Component Value Date/Time   COLORURINE YELLOW 12/10/2017 1250   APPEARANCEUR CLEAR 12/10/2017 1250   LABSPEC 1.014 12/10/2017 1250   PHURINE 6.0 12/10/2017 1250   GLUCOSEU NEGATIVE 12/10/2017 1250   HGBUR NEGATIVE 12/10/2017 1250   BILIRUBINUR NEGATIVE 12/10/2017 1250   KETONESUR NEGATIVE 12/10/2017 1250   PROTEINUR NEGATIVE 12/10/2017 1250   UROBILINOGEN 0.2 01/05/2014 1922   NITRITE NEGATIVE 12/10/2017 1250   LEUKOCYTESUR MODERATE (A) 12/10/2017 1250    Assessment and Plan  G4P1 [redacted]w[redacted]d Sinus tachy FHT reassuring / reactive Normal cardiac w/u last week w/ Dr Woody Seller at [facility] Normal TFT's last week No dehydration, metabolic panel grossly wnl, and mild anemia (hgb 10.8)  Suspect anxiety/panic attack, social stressors contributing Discussed with patient self care, counseling at [place] as previously encouraged, and offered anti-anxiety medication. Will trial BuSpar, rx sent, r/b of meds discussed.  F/U outpatient with scheduled visit 12/16/17  Dr. Ronita Hipps consult done  Juliene Pina, CNM 12/10/2017, 1:32 PM

## 2018-02-22 IMAGING — US US MFM OB DETAIL+14 WK
1 series · 14 of 28 positions shown · non-contrast
Comparison: none

[Series 1: us mfm ob detail+14 wk · 14 of 68 slices shown]
[im 3/68]
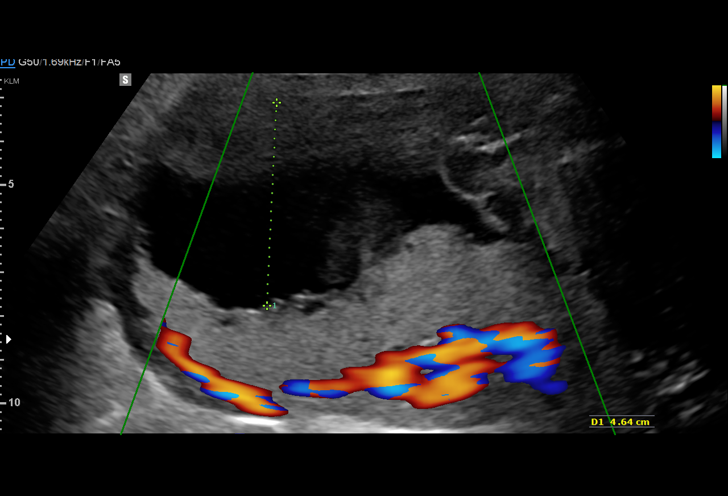
[im 8/68]
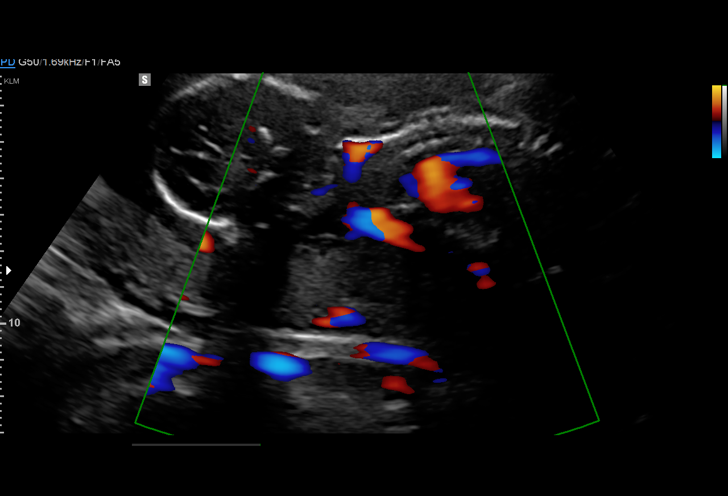
[im 13/68]
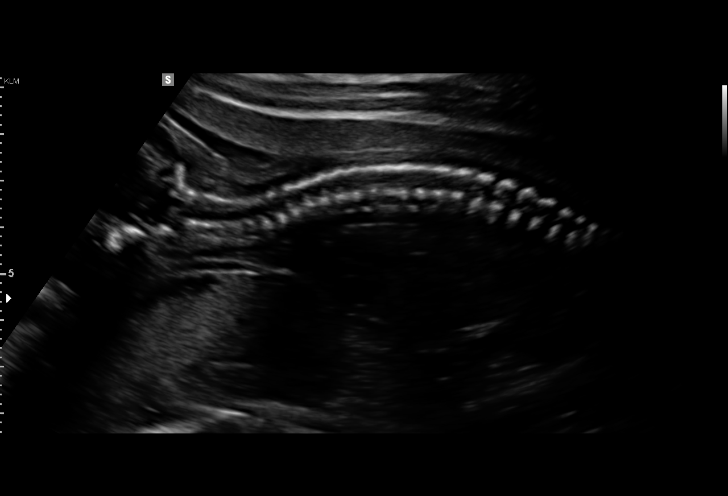
[im 18/68]
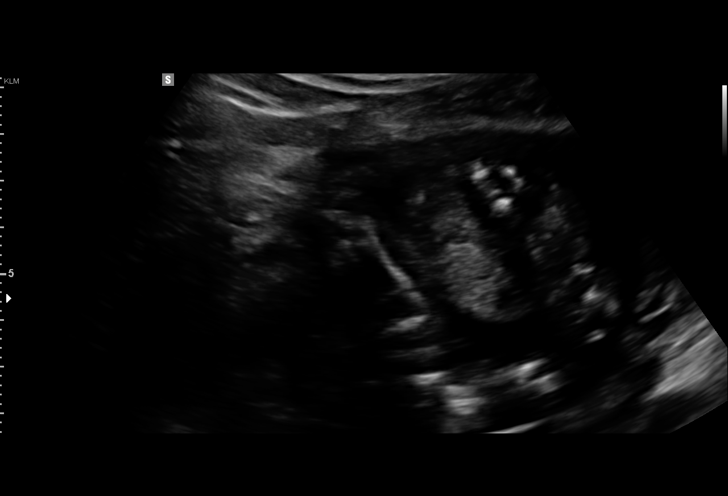
[im 23/68]
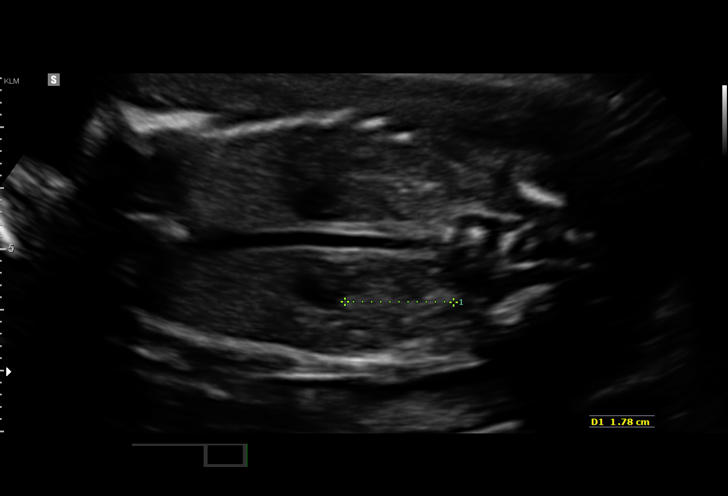
[im 28/68]
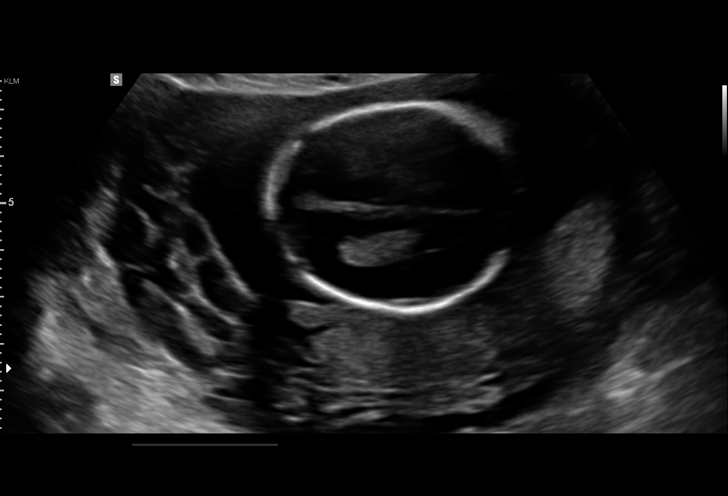
[im 33/68]
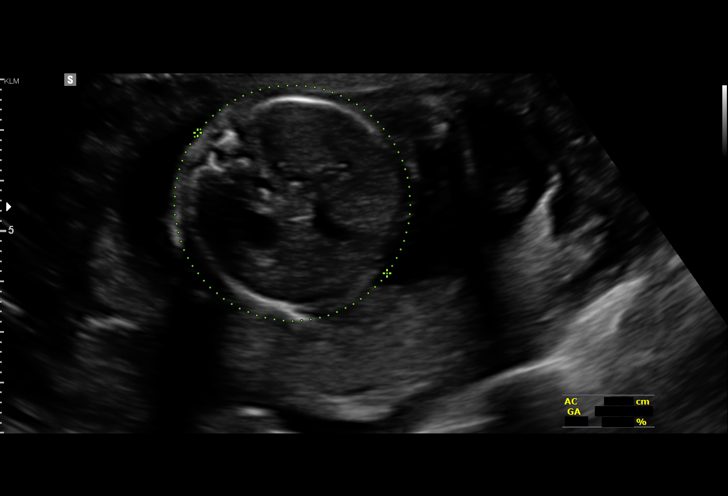
[im 38/68]
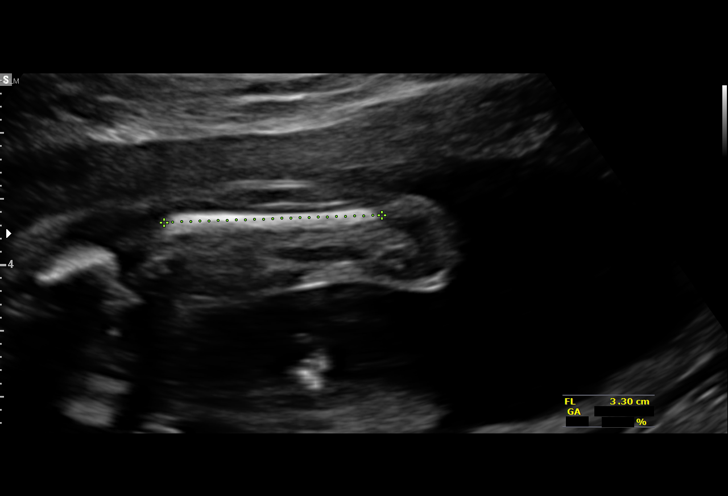
[im 43/68]
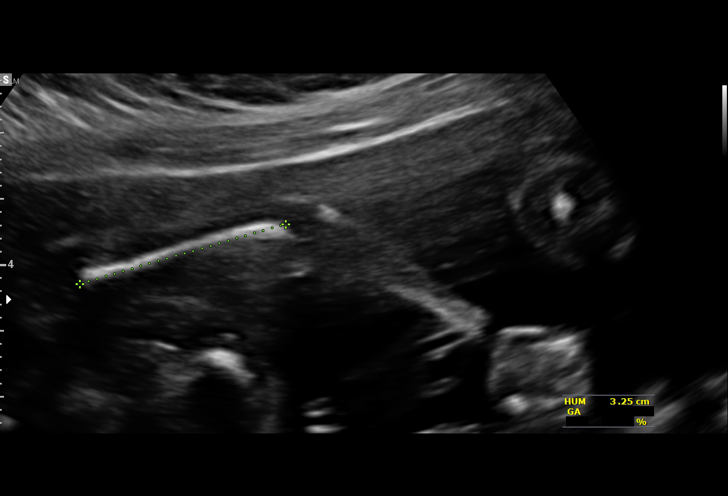
[im 48/68]
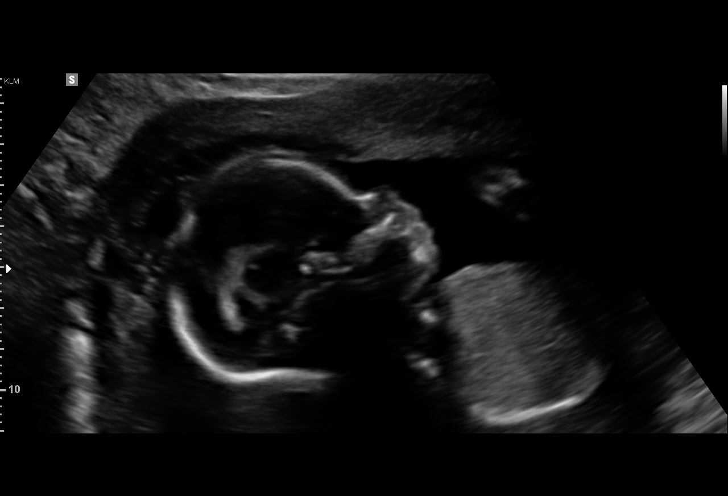
[im 53/68]
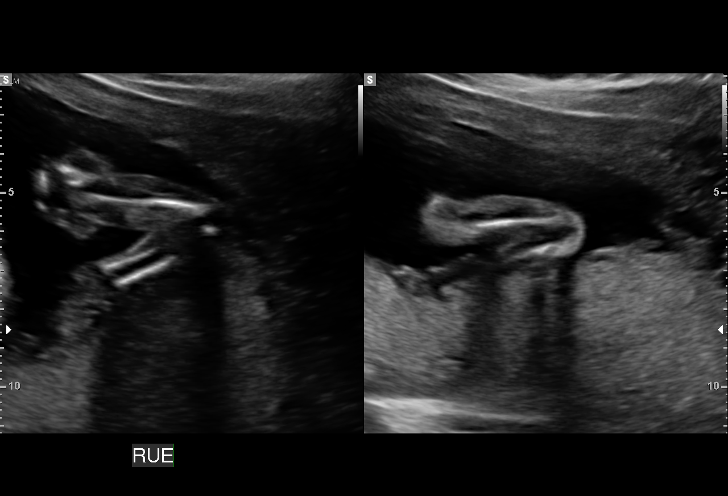
[im 58/68]
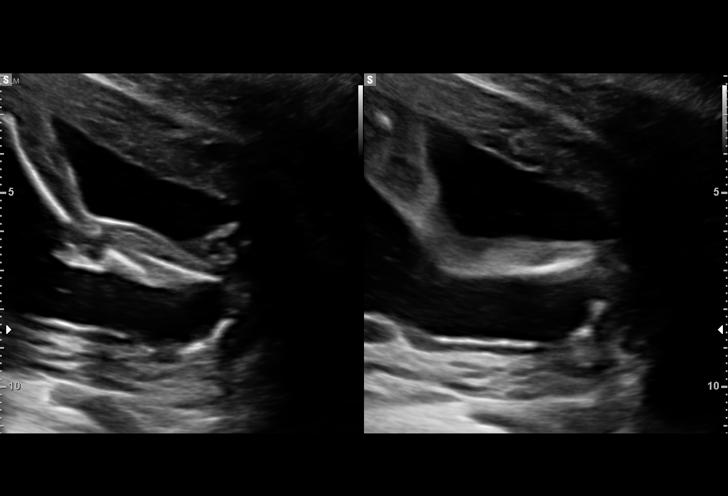
[im 63/68]
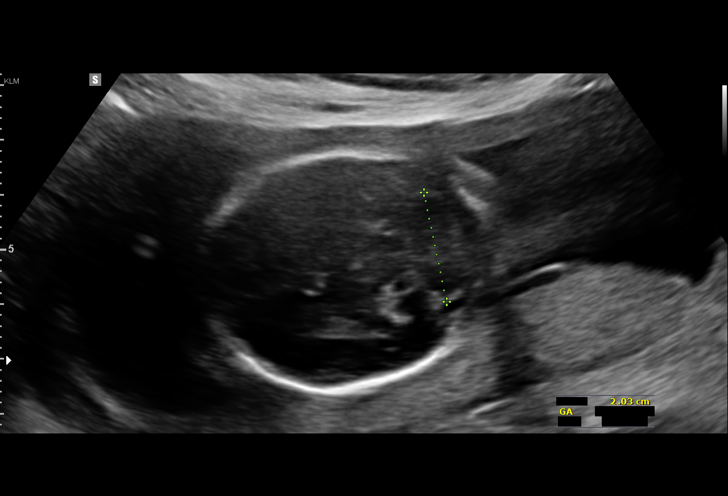
[im 68/68]
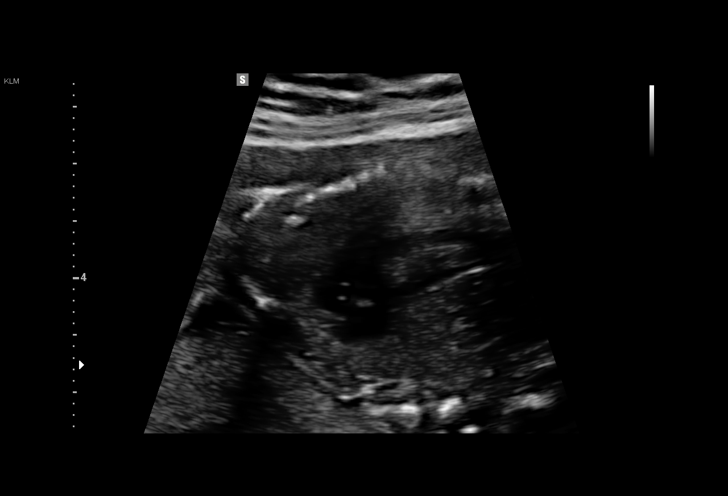

[14 of 28 positions shown; findings below may reference images not displayed]

1  HOK ARIEF             [id number]      [id number]     [id number]
Indications

20 weeks gestation of pregnancy
Detailed fetal anatomic survey                 Z36
Obesity complicating pregnancy, second
trimester (BMI 39)
OB History

Blood Type:            Height:  5'     Weight (lb):  200      BMI:
Gravidity:    3         Term:   0        Prem:   0        SAB:   1
TOP:          0       Ectopic:  1        Living: 0
Fetal Evaluation

Num Of Fetuses:     1
Fetal Heart         148
Rate(bpm):
Cardiac Activity:   Observed
Presentation:       Breech
Placenta:           Posterior, above cervical os
P. Cord Insertion:  Visualized

Amniotic Fluid
AFI FV:      Subjectively within normal limits

Largest Pocket(cm)
4.6
Biometry

BPD:      44.3  mm     G. Age:  19w 3d         16  %    CI:        68.02   %   70 - 86
FL/HC:      19.5   %   16.8 -
HC:      171.8  mm     G. Age:  19w 5d         20  %    HC/AC:      1.16       1.09 -
AC:      148.4  mm     G. Age:  20w 1d         39  %    FL/BPD:     75.6   %
FL:       33.5  mm     G. Age:  20w 4d         50  %    FL/AC:      22.6   %   20 - 24
HUM:      32.5  mm     G. Age:  20w 6d         68  %

Est. FW:     339  gm    0 lb 12 oz      46  %
Gestational Age

LMP:           20w 2d       Date:   01/01/16                 EDD:   10/07/16
U/S Today:     20w 0d                                        EDD:   10/09/16
Best:          20w 2d    Det. By:   LMP  (01/01/16)          EDD:   10/07/16
Anatomy

Cranium:               Appears normal         Aortic Arch:            Appears normal
Cavum:                 Appears normal         Ductal Arch:            Appears normal
Ventricles:            Appears normal         Diaphragm:              Appears normal
Choroid Plexus:        Appears normal         Stomach:                Appears normal, left
sided
Cerebellum:            Appears normal         Abdomen:                Appears normal
Posterior Fossa:       Not well visualized    Abdominal Wall:         Appears nml (cord
insert, abd wall)
Nuchal Fold:           Not applicable (>20    Cord Vessels:           Appears normal (3
wks GA)                                        vessel cord)
Face:                  Appears normal         Kidneys:                Appear normal
(orbits and profile)
Lips:                  Appears normal         Bladder:                Appears normal
Thoracic:              Appears normal         Spine:                  Appears normal
Heart:                 Not well visualized    Upper Extremities:      Appears normal
RVOT:                  Not well visualized    Lower Extremities:      Appears normal
LVOT:                  Not well visualized

Other:  Fetus appears to be a female. Heels visualized. Nasal bone
visualized.
Cervix Uterus Adnexa

Cervix
Length:            3.5  cm.
Normal appearance by transabdominal scan.
Impression

Single IUP at 20w 2d
Limited views of the fetal heart and posterior fossa obtained
The remainder of the fetal anatomy appears normal
Posterior placenta without previa
Normal amniotic fluid volume
Recommendations

Recommend follow-up ultrasound examination in 4 weeks to
reevaluate the fetal heart and posterior fossa

## 2018-04-11 ENCOUNTER — Encounter (HOSPITAL_COMMUNITY): Payer: Self-pay

## 2018-09-10 DIAGNOSIS — N920 Excessive and frequent menstruation with regular cycle: Secondary | ICD-10-CM | POA: Diagnosis not present

## 2018-09-10 DIAGNOSIS — Z124 Encounter for screening for malignant neoplasm of cervix: Secondary | ICD-10-CM | POA: Diagnosis not present

## 2018-09-10 DIAGNOSIS — Z113 Encounter for screening for infections with a predominantly sexual mode of transmission: Secondary | ICD-10-CM | POA: Diagnosis not present

## 2018-09-10 DIAGNOSIS — Z01411 Encounter for gynecological examination (general) (routine) with abnormal findings: Secondary | ICD-10-CM | POA: Diagnosis not present

## 2018-09-10 DIAGNOSIS — Z6841 Body Mass Index (BMI) 40.0 and over, adult: Secondary | ICD-10-CM | POA: Diagnosis not present

## 2018-09-10 DIAGNOSIS — R87610 Atypical squamous cells of undetermined significance on cytologic smear of cervix (ASC-US): Secondary | ICD-10-CM | POA: Diagnosis not present

## 2018-09-10 DIAGNOSIS — N760 Acute vaginitis: Secondary | ICD-10-CM | POA: Diagnosis not present

## 2018-09-10 DIAGNOSIS — N898 Other specified noninflammatory disorders of vagina: Secondary | ICD-10-CM | POA: Diagnosis not present

## 2018-09-16 ENCOUNTER — Ambulatory Visit: Payer: BLUE CROSS/BLUE SHIELD | Admitting: Obstetrics and Gynecology

## 2018-09-29 DIAGNOSIS — D259 Leiomyoma of uterus, unspecified: Secondary | ICD-10-CM | POA: Diagnosis not present

## 2018-09-29 DIAGNOSIS — R87610 Atypical squamous cells of undetermined significance on cytologic smear of cervix (ASC-US): Secondary | ICD-10-CM | POA: Diagnosis not present

## 2018-09-29 DIAGNOSIS — N920 Excessive and frequent menstruation with regular cycle: Secondary | ICD-10-CM | POA: Diagnosis not present

## 2018-09-29 DIAGNOSIS — R87810 Cervical high risk human papillomavirus (HPV) DNA test positive: Secondary | ICD-10-CM | POA: Diagnosis not present

## 2018-10-14 ENCOUNTER — Other Ambulatory Visit: Payer: Self-pay | Admitting: Obstetrics & Gynecology

## 2018-10-14 DIAGNOSIS — B977 Papillomavirus as the cause of diseases classified elsewhere: Secondary | ICD-10-CM | POA: Diagnosis not present

## 2018-10-14 DIAGNOSIS — R87610 Atypical squamous cells of undetermined significance on cytologic smear of cervix (ASC-US): Secondary | ICD-10-CM | POA: Diagnosis not present

## 2018-10-14 DIAGNOSIS — A630 Anogenital (venereal) warts: Secondary | ICD-10-CM | POA: Diagnosis not present

## 2018-10-16 ENCOUNTER — Other Ambulatory Visit: Payer: Self-pay | Admitting: Obstetrics & Gynecology

## 2018-11-10 ENCOUNTER — Encounter (HOSPITAL_COMMUNITY): Payer: Self-pay | Admitting: *Deleted

## 2018-11-21 ENCOUNTER — Ambulatory Visit (HOSPITAL_COMMUNITY)
Admission: RE | Admit: 2018-11-21 | Payer: BLUE CROSS/BLUE SHIELD | Source: Ambulatory Visit | Admitting: Obstetrics & Gynecology

## 2018-11-21 SURGERY — LIGATION, FALLOPIAN TUBE, LAPAROSCOPIC
Anesthesia: General | Laterality: Bilateral

## 2018-12-18 DIAGNOSIS — Z30430 Encounter for insertion of intrauterine contraceptive device: Secondary | ICD-10-CM | POA: Diagnosis not present

## 2019-01-22 DIAGNOSIS — Z119 Encounter for screening for infectious and parasitic diseases, unspecified: Secondary | ICD-10-CM | POA: Diagnosis not present

## 2019-05-19 IMAGING — US US MFM OB DETAIL+14 WK
1 series · 14 of 28 positions shown · non-contrast
Comparison: none

[Series 1: us mfm ob detail+14 wk · 14 of 93 slices shown]
[im 4/93]
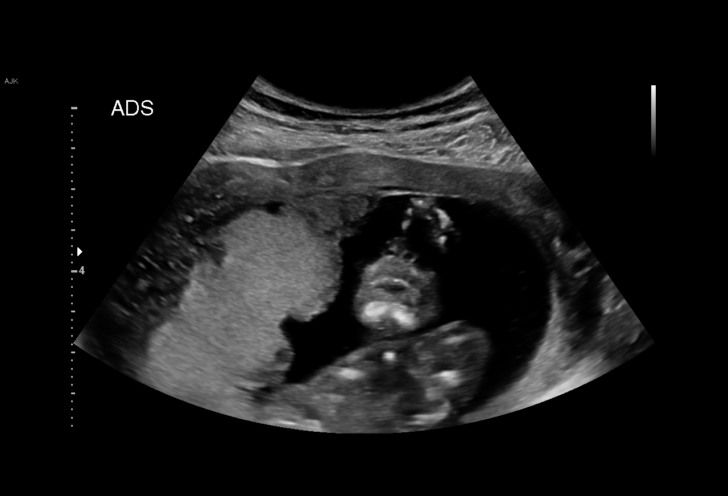
[im 11/93]
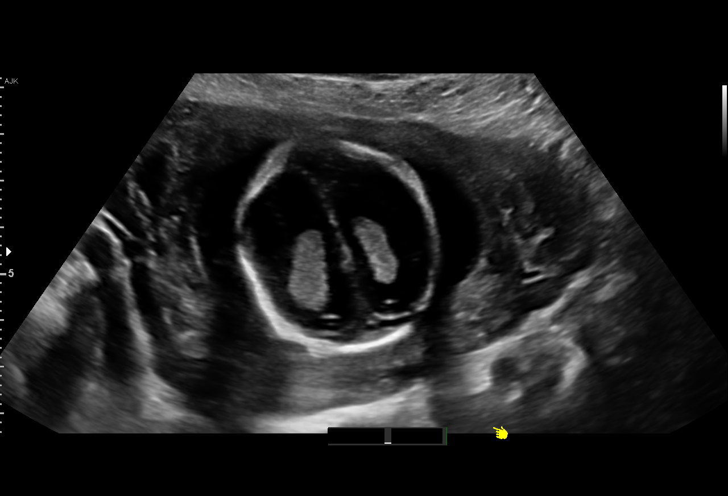
[im 18/93]
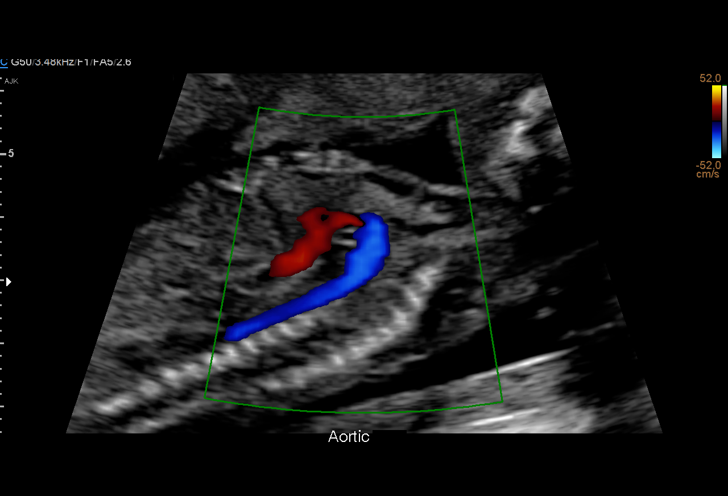
[im 24/93]
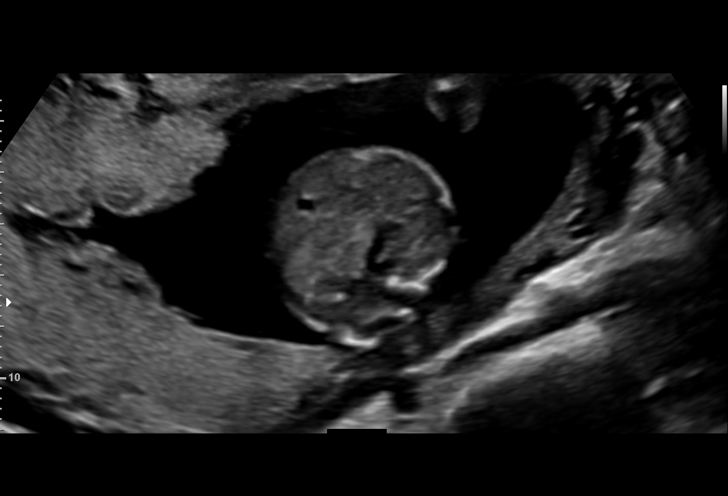
[im 31/93]
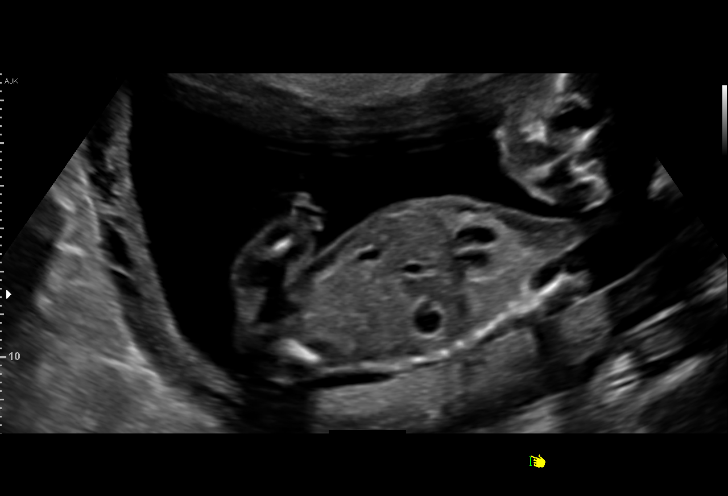
[im 38/93]
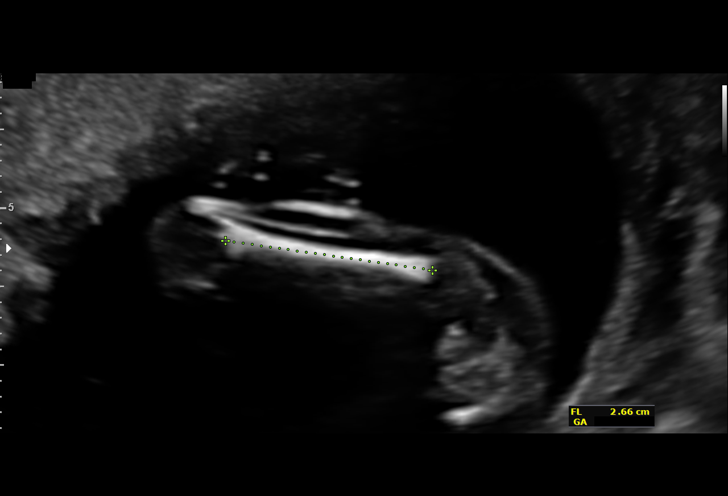
[im 45/93]
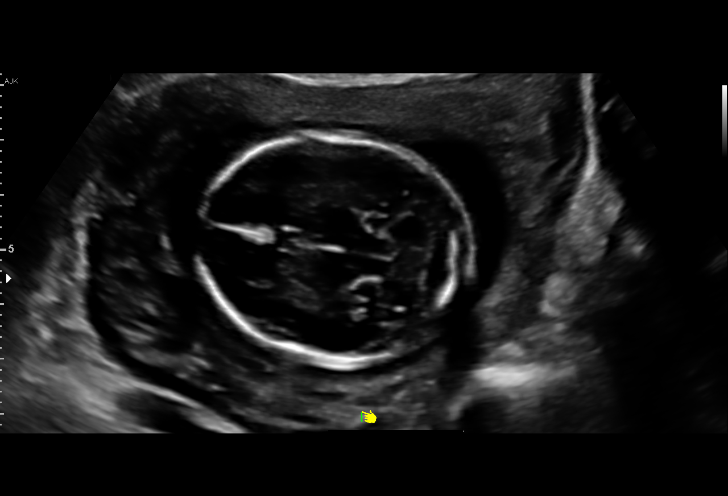
[im 52/93]
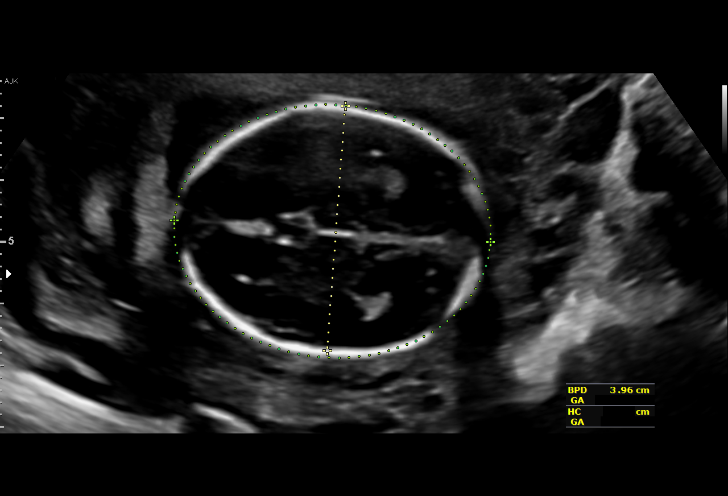
[im 58/93]
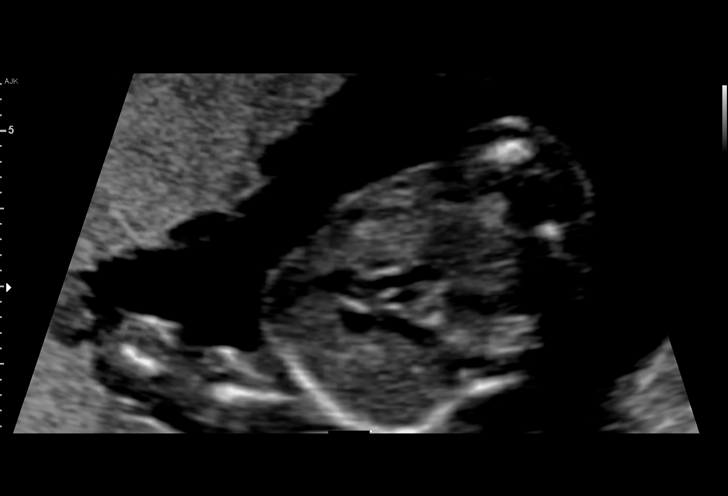
[im 65/93]
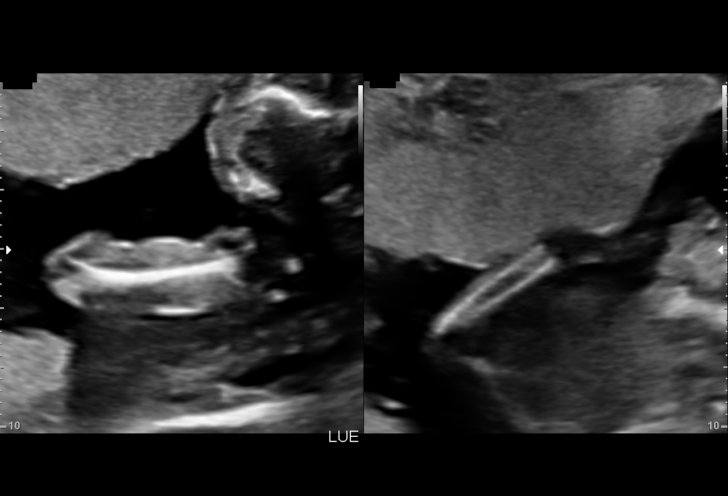
[im 72/93]
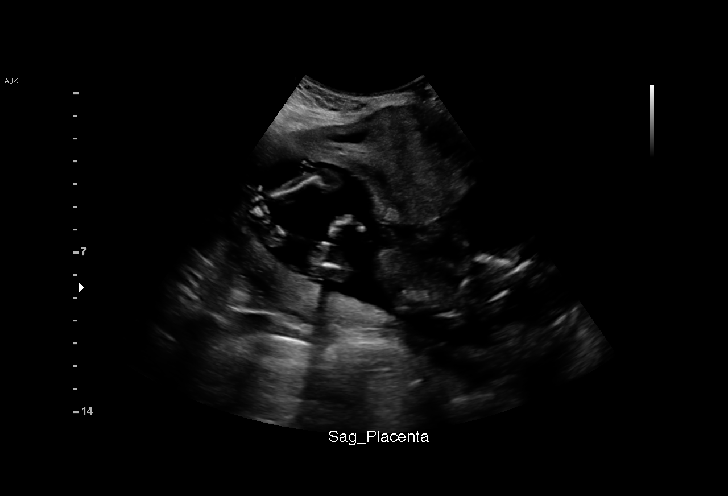
[im 79/93]
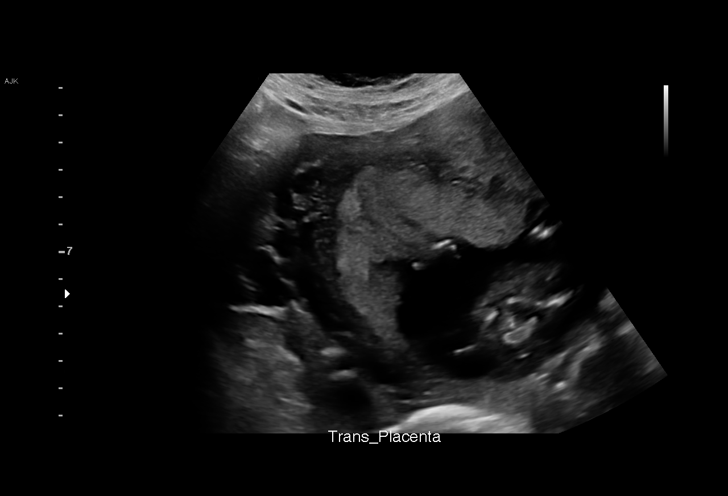
[im 86/93]
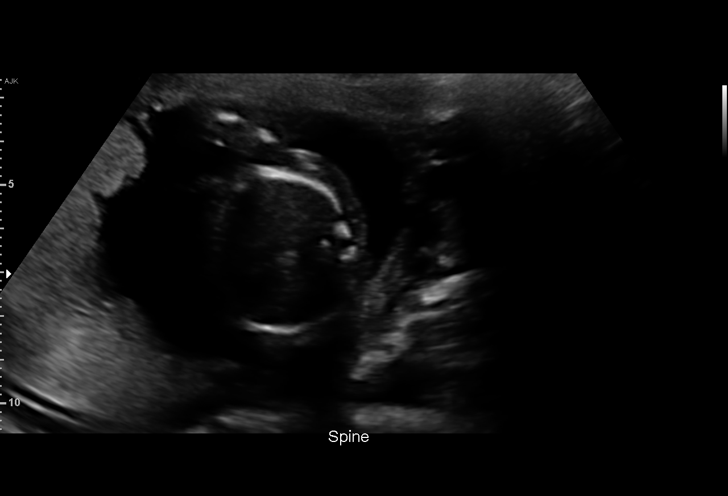
[im 93/93]
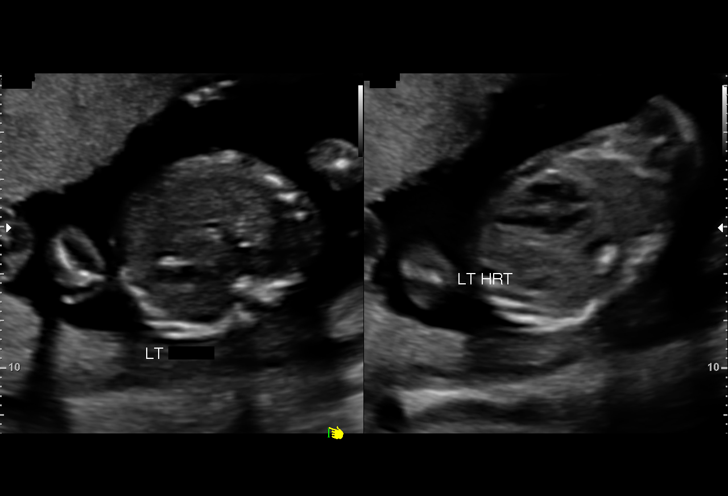

[14 of 28 positions shown; findings below may reference images not displayed]

pm)

[id number] [REDACTED]

1  SOLY SILBER             [id number]      [id number]     [id number]
Indications

18 weeks gestation of pregnancy
Obesity complicating pregnancy, second
trimester
Encounter for fetal anatomic survey
OB History

Blood Type:            Height:  5'0"   Weight (lb):  216       BMI:
Gravidity:    4         Term:   1        Prem:   0        SAB:   1
TOP:          0       Ectopic:  1        Living: 1
Fetal Evaluation

Num Of Fetuses:     1
Fetal Heart         153
Rate(bpm):
Cardiac Activity:   Observed
Presentation:       Cephalic
Placenta:           Left lateral, above cervical os
P. Cord Insertion:  Visualized, central

Amniotic Fluid
AFI FV:      Subjectively within normal limits

Largest Pocket(cm)
5.65
Biometry

BPD:      39.5  mm     G. Age:  18w 0d         51  %    CI:        73.67   %    70 - 86
FL/HC:      18.5   %    15.8 - 18
HC:      146.2  mm     G. Age:  17w 6d         32  %    HC/AC:      1.11        1.07 -
AC:      131.8  mm     G. Age:  18w 5d         70  %    FL/BPD:     68.4   %
FL:         27  mm     G. Age:  18w 1d         54  %    FL/AC:      20.5   %    20 - 24
CER:      18.7  mm     G. Age:  18w 2d         61  %
NFT:       3.8  mm

CM:        4.1  mm

Est. FW:     238  gm      0 lb 8 oz     56  %
Gestational Age

LMP:           18w 0d        Date:  04/12/17                 EDD:   01/17/18
U/S Today:     18w 1d                                        EDD:   01/16/18
Best:          18w 0d     Det. By:  LMP  (04/12/17)          EDD:   01/17/18
Anatomy

Cranium:               Appears normal         Aortic Arch:            Appears normal
Cavum:                 Appears normal         Ductal Arch:            Appears normal
Ventricles:            Appears normal         Diaphragm:              Appears normal
Choroid Plexus:        Appears normal         Stomach:                Appears normal, left
sided
Cerebellum:            Appears normal         Abdomen:                Appears normal
Posterior Fossa:       Appears normal         Abdominal Wall:         Appears nml (cord
insert, abd wall)
Nuchal Fold:           Appears normal         Cord Vessels:           Appears normal (3
vessel cord)
Face:                  Appears normal         Kidneys:                Appear normal
(orbits and profile)
Lips:                  Appears normal         Bladder:                Appears normal
Thoracic:              Appears normal         Spine:                  Ltd views no
intracranial signs of
NT
Heart:                 Appears normal         Upper Extremities:      Appears normal
(4CH, axis, and
situs)
RVOT:                  Appears normal         Lower Extremities:      Appears normal
LVOT:                  Appears normal

Other:  Fetus appears to be a female. Heels and 5th digit visualized. Nasal
bone visualized. Technically difficult due to fetal position.
Cervix Uterus Adnexa

Cervix
Length:           3.79  cm.
Normal appearance by transabdominal scan.

Uterus
No abnormality visualized.

Left Ovary
Within normal limits.

Right Ovary
Not visualized.

Adnexa:       No abnormality visualized.
Impression

Singleton intrauterine pregnancy at 18+0 weeks
Review of the anatomy shows no sonographic markers for
aneuploidy or structural anomalies
However, evaluations should be considered suboptimal
secondary to fetal position
Amniotic fluid volume is normal
Estimated fetal weight is 238g which is growth in the 56th
percentile
Recommendations

Follow-up ultrasounds as clinically indicated.

## 2019-08-28 IMAGING — US US MFM OB FOLLOW-UP
1 series · 14 of 28 positions shown · non-contrast
Comparison: none

[Series 1: us mfm ob follow-up · 49 acquisitions, 14 frames shown]
[im 2/49]
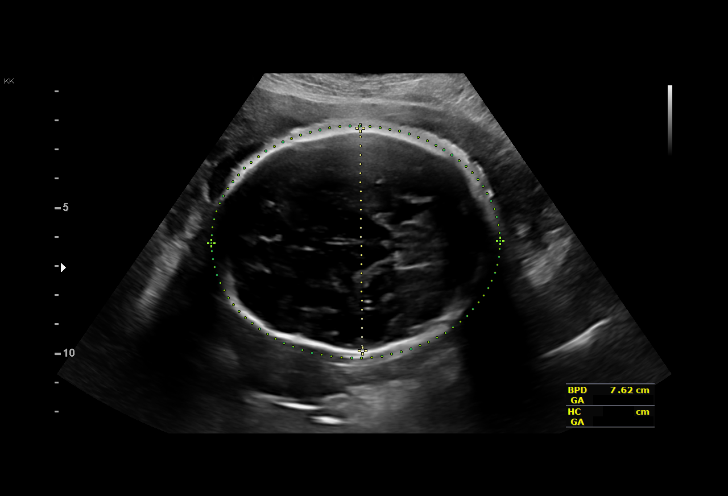
[im 6/49]
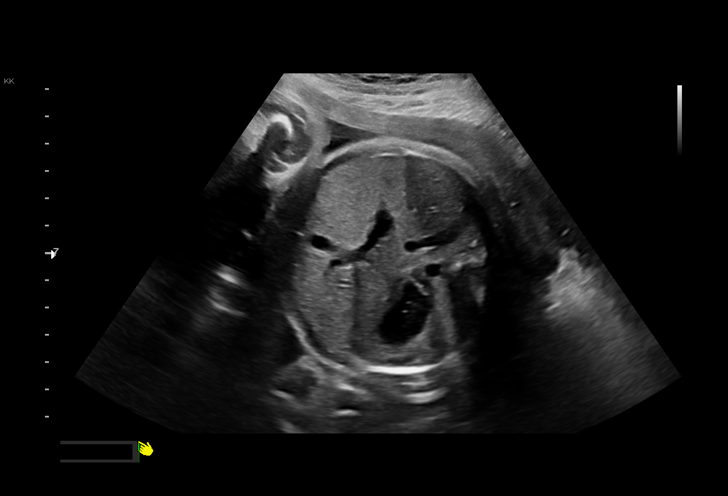
[im 9/49]
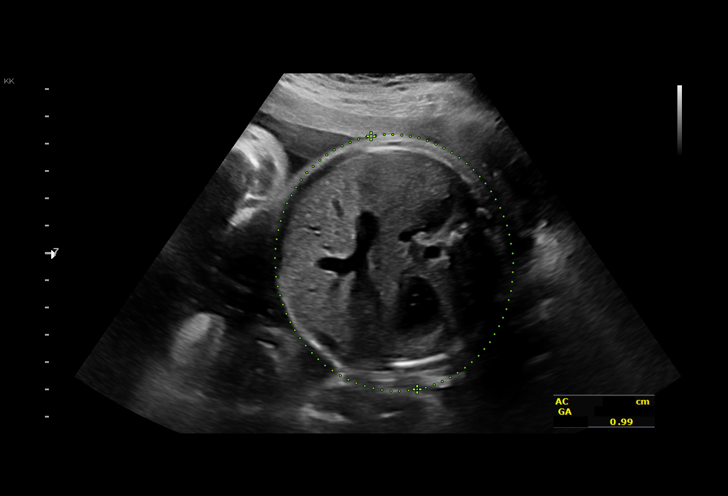
[im 13/49]
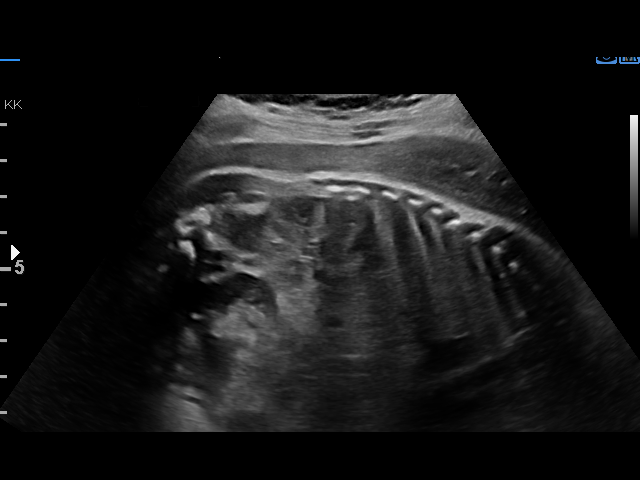
[im 17/49]
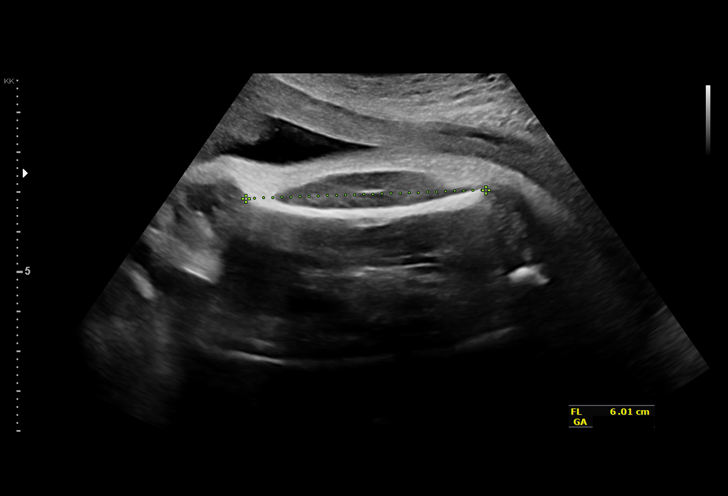
[im 20/49]
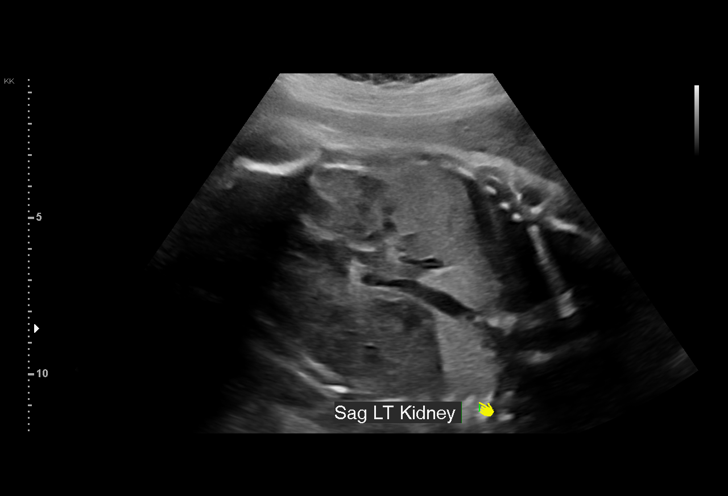
[im 24/49]
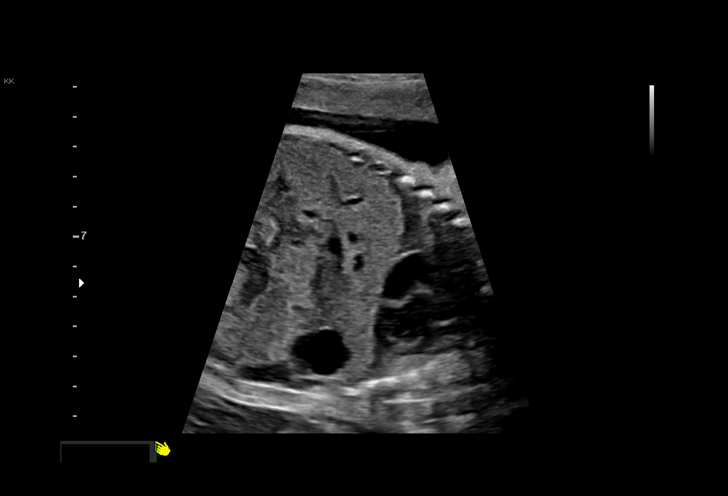
[im 27/49]
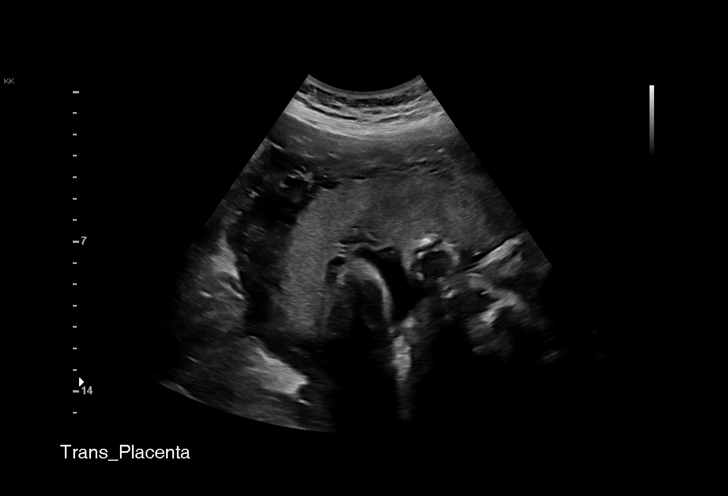
[im 31/49]
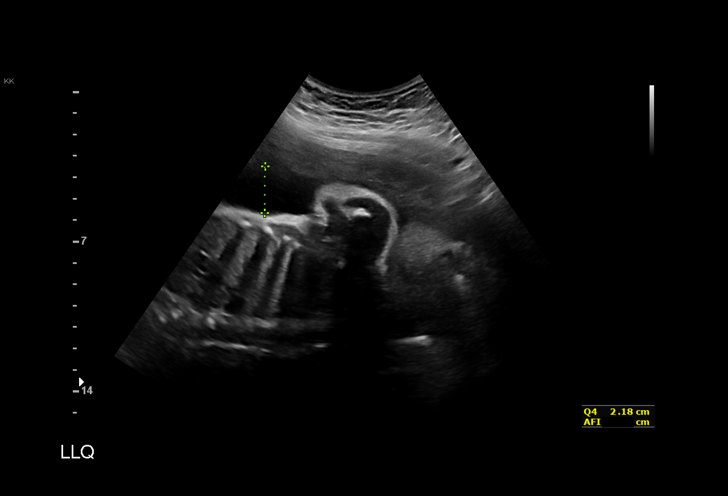
[im 34/49]
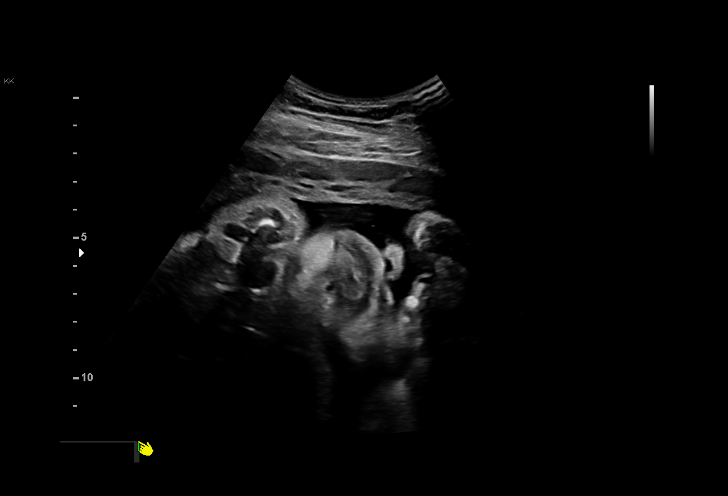
[im 38/49]
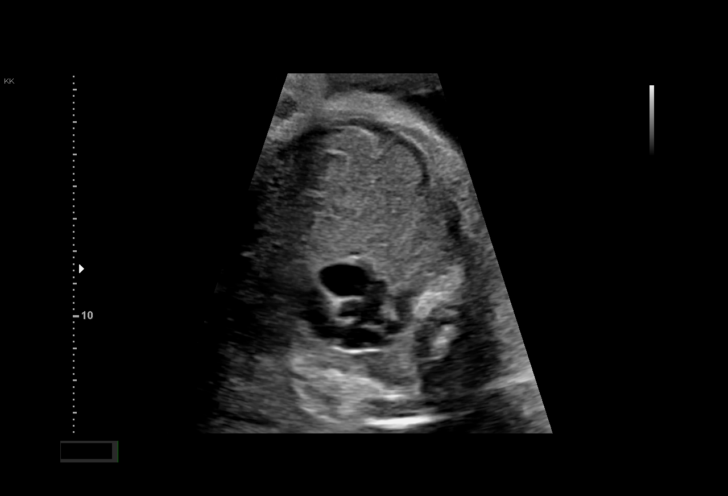
[im 41/49]
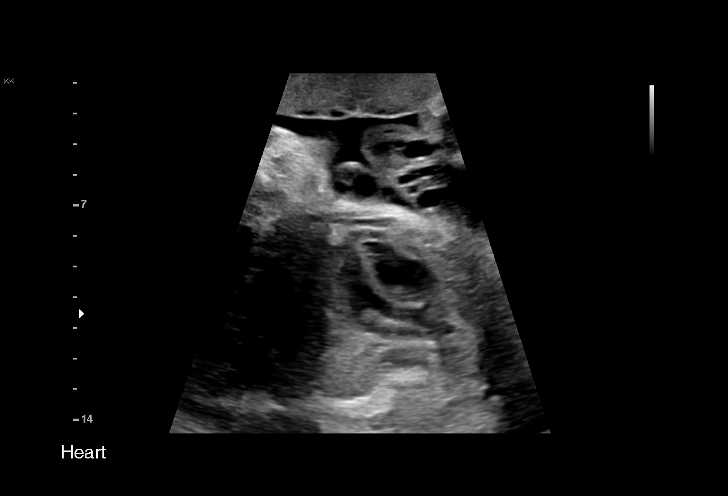
[im 45/49]
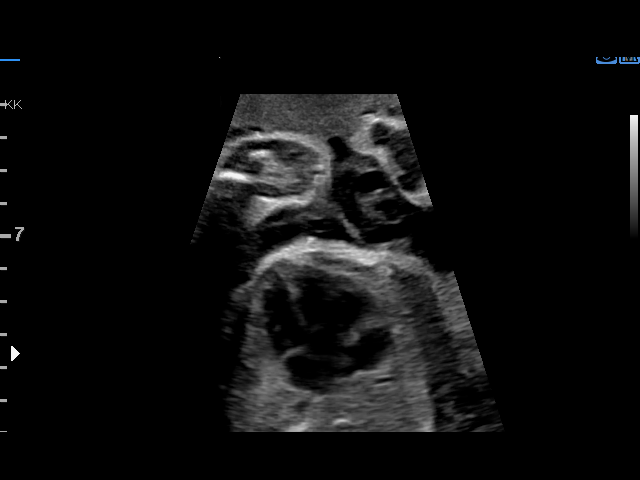
[im 49/49]
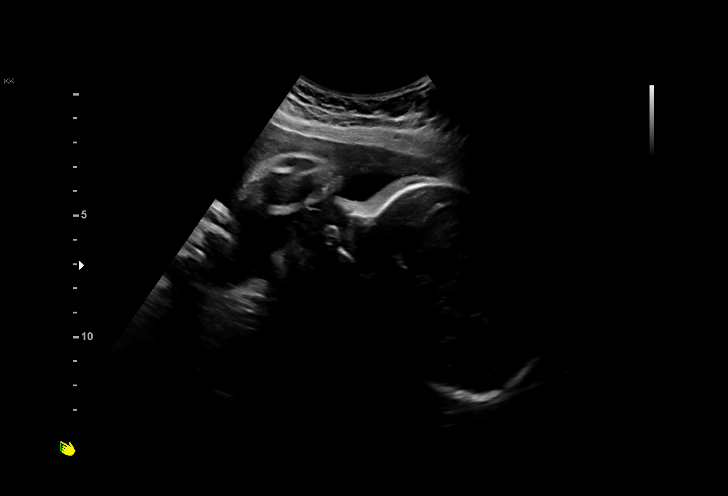

[14 of 28 positions shown; findings below may reference images not displayed]

[id number] [REDACTED]

1  WILLIE LEGORRETA             [id number]      [id number]     [id number]
Indications

32 weeks gestation of pregnancy
Encounter for other antenatal screening
follow-up
Maternal morbid obesity
OB History

Blood Type:            Height:  5'0"   Weight (lb):  216       BMI:
Gravidity:    4         Term:   1        Prem:   0        SAB:   1
TOP:          0       Ectopic:  1        Living: 1
Fetal Evaluation

Num Of Fetuses:     1
Fetal Heart         143
Rate(bpm):
Cardiac Activity:   Observed
Presentation:       Cephalic
Placenta:           Left lateral, above cervical os
P. Cord Insertion:  Previously Visualized

Amniotic Fluid
AFI FV:      Subjectively within normal limits

AFI Sum(cm)     %Tile       Largest Pocket(cm)
12.91           39

RUQ(cm)       RLQ(cm)       LUQ(cm)        LLQ(cm)
3.11
Biometry
BPD:      77.2  mm     G. Age:  31w 0d          8  %    CI:        74.31   %    70 - 86
FL/HC:      21.0   %    19.1 -
HC:      284.3  mm     G. Age:  31w 2d        < 3  %    HC/AC:      0.98        0.96 -
AC:      289.5  mm     G. Age:  33w 0d         65  %    FL/BPD:     77.5   %    71 - 87
FL:       59.8  mm     G. Age:  31w 1d         10  %    FL/AC:      20.7   %    20 - 24

Est. FW:    4903  gm      4 lb 3 oz     50  %
Gestational Age

LMP:           32w 3d        Date:  04/12/17                 EDD:   01/17/18
U/S Today:     31w 4d                                        EDD:   01/23/18
Best:          32w 3d     Det. By:  LMP  (04/12/17)          EDD:   01/17/18
Anatomy

Cranium:               Appears normal         Aortic Arch:            Previously seen
Cavum:                 Previously seen        Ductal Arch:            Previously seen
Ventricles:            Appears normal         Diaphragm:              Previously seen
Choroid Plexus:        Previously seen        Stomach:                Appears normal, left
sided
Cerebellum:            Previously seen        Abdomen:                Appears normal
Posterior Fossa:       Previously seen        Abdominal Wall:         Previously seen
Nuchal Fold:           Previously seen        Cord Vessels:           Previously seen
Face:                  Orbits and profile     Kidneys:                Appear normal
previously seen
Lips:                  Previously seen        Bladder:                Appears normal
Thoracic:              Appears normal         Spine:                  Ltd views no
intracranial signs of
NT
Heart:                 Previously seen        Upper Extremities:      Previously seen
RVOT:                  Previously seen        Lower Extremities:      Previously seen
LVOT:                  Previously seen

Other:  Fetus appears to be a female. Heels, 5th digit, and Nasal bone
previously visualized. Technically difficult due to fetal position.
Cervix Uterus Adnexa

Cervix
Not visualized (advanced GA >78wks)
Impression

SIUP at 32+3 weeks
Normal interval anatomy; anatomic survey complete
Normal amniotic fluid volume
Appropriate interval growth with EFW at the 50th %tile
Recommendations

Follow-up as clinically indicated

## 2021-01-25 ENCOUNTER — Other Ambulatory Visit: Payer: Self-pay | Admitting: Obstetrics & Gynecology

## 2021-03-29 NOTE — H&P (Signed)
Hayley Chambers is a 35 y.o. female P: 2-0-2-2 who presents for permanent sterilization due to her desire to cease childbearing.  In the past the patient has used oral contraceptives, birth control patch and ring, Depo Provera injection and the IUD.  Her menstrual flow is monthly for 7 days with a pad change only twice a day with minimal cramping.  She denies any problems with vaginitis symptoms, dyspareunia, pelvic pain, changes in bowel or bladder function.  She has now decided that she wants to discontinue reversible contraception and  proceed with permanent sterilization .  Past Medical History  OB History: G: 4; P: 2-0-2-2;  SVB: 2017 and 2019; #4 adopted children  GYN History: menarche: 31 YO;   LMP: 03/20/2021   Contraception: Mirena IUDl  Last PAP smear: 2022 normal with negative HPV  Medical History: Vitamin D Deficiency, Bilateral Knee Arthritis, Menstrual Migraines and Uterine Fibroids  Surgical History: 2007 Tonsillectomy; 2012 Unilateral Partial Tube Removal (ectopic) and 2014 D & C Denies problems with anesthesia or history of blood transfusions  Family History: Hypertension, Heart Disease, Stroke, Diabetes Mellitus and Breast Cancer  Social History: Single and employed with [employer]; Denies tobacco use and occasionally consumes alcohol   Medications: Cetirizine 10 mg daily prn Mirena IUD  No Known Allergies   Denies sensitivity to peanuts, shellfish, soy, latex or adhesives.   ROS: Admits to menstrual migraines, occasional stress urinary incontinence; bilateral knee pain but denies corrective lenses,  vision changes, nasal congestion, dysphagia, tinnitus, dizziness, hoarseness, cough,  chest pain, shortness of breath, nausea, vomiting, diarrhea,constipation,  urinary frequency, urgency  dysuria, hematuria, vaginitis symptoms, pelvic pain, swelling of joints,easy bruising,  myalgias, arthralgias, skin rashes, unexplained weight loss and except as is mentioned in the  history of present illness, patient's review of systems is otherwise negative.     Physical Exam  Bp: 102/60;  P: 86 bpm;  Weight: 227 lbs.; Height: 5';  BMI: 44.3; O2Sat. 98% (room air);  Temperature: 98.5 degree F orally  Neck: supple without masses or thyromegaly Lungs: clear to auscultation Heart: regular rate and rhythm Abdomen: soft, non-tender and no organomegaly Pelvic:EGBUS- wnl; vagina-normal rugae; uterus-normal size, cervix without lesions or motion tenderness; adnexae-no tenderness or masses Extremities:  no clubbing, cyanosis or edema   Assesment: Desire for Permanent Sterilzation   Disposition:  A discussion was held with patient regarding the indication for her procedure(s) along with the risks, which include but are not limited to: reaction to anesthesia, damage to adjacent organs, infection and excessive bleeding. The patient verbalized understanding of these risks and has consented to proceed with Laparoscopic Bilateral Salpingectomy and Removal of IUD at [facility] on April 07, 2021.   CSN# [id number]   [place] J. Florene Glen, PA-C  for Dr. Waymon Amato

## 2021-04-04 ENCOUNTER — Encounter (HOSPITAL_BASED_OUTPATIENT_CLINIC_OR_DEPARTMENT_OTHER): Payer: Self-pay | Admitting: Obstetrics & Gynecology

## 2021-04-04 ENCOUNTER — Other Ambulatory Visit: Payer: Self-pay

## 2021-04-04 NOTE — Progress Notes (Signed)
Spoke w/ via phone for pre-op interview--- Pt Lab needs dos-- CBC, BMP, Urine preg            Lab results------ nop COVID test -----patient states asymptomatic no test needed Arrive at ------- 1030  On 04-07-2021 NPO after MN NO Solid Food.  Clear liquids from MN until--- 0930 Med rec completed Medications to take morning of surgery ----- NONE Diabetic medication ----- n/a Patient instructed no nail polish to be worn day of surgery Patient instructed to bring photo id and insurance card day of surgery Patient aware to have Driver (ride ) / caregiver    for 24 hours after surgery -- niece, Angelina Sheriff Patient Special Instructions ----- n/a Pre-Op special Istructions ----- n/a Patient verbalized understanding of instructions that were given at this phone interview. Patient denies shortness of breath, chest pain, fever, cough at this phone interview.

## 2021-04-07 ENCOUNTER — Ambulatory Visit (HOSPITAL_BASED_OUTPATIENT_CLINIC_OR_DEPARTMENT_OTHER): Admit: 2021-04-07 | Payer: BLUE CROSS/BLUE SHIELD | Admitting: Obstetrics & Gynecology

## 2021-04-07 ENCOUNTER — Ambulatory Visit (HOSPITAL_BASED_OUTPATIENT_CLINIC_OR_DEPARTMENT_OTHER)
Admission: RE | Admit: 2021-04-07 | Discharge: 2021-04-07 | Disposition: A | Discharge status: Home / Self Care | Payer: 59 | Attending: Obstetrics & Gynecology | Admitting: Obstetrics & Gynecology

## 2021-04-07 ENCOUNTER — Encounter (HOSPITAL_BASED_OUTPATIENT_CLINIC_OR_DEPARTMENT_OTHER): Payer: Self-pay | Admitting: Obstetrics & Gynecology

## 2021-04-07 ENCOUNTER — Ambulatory Visit (HOSPITAL_BASED_OUTPATIENT_CLINIC_OR_DEPARTMENT_OTHER): Payer: 59 | Admitting: Anesthesiology

## 2021-04-07 ENCOUNTER — Encounter (HOSPITAL_BASED_OUTPATIENT_CLINIC_OR_DEPARTMENT_OTHER): Payer: Self-pay

## 2021-04-07 ENCOUNTER — Encounter (HOSPITAL_BASED_OUTPATIENT_CLINIC_OR_DEPARTMENT_OTHER)
Admission: RE | Disposition: A | Discharge status: Home / Self Care | Payer: Self-pay | Source: Home / Self Care | Attending: Obstetrics & Gynecology

## 2021-04-07 ENCOUNTER — Other Ambulatory Visit: Payer: Self-pay

## 2021-04-07 DIAGNOSIS — Z30432 Encounter for removal of intrauterine contraceptive device: Secondary | ICD-10-CM | POA: Insufficient documentation

## 2021-04-07 DIAGNOSIS — Z302 Encounter for sterilization: Secondary | ICD-10-CM | POA: Diagnosis not present

## 2021-04-07 HISTORY — DX: Prediabetes: R73.03

## 2021-04-07 HISTORY — PX: LAPAROSCOPIC BILATERAL SALPINGECTOMY: SHX5889

## 2021-04-07 HISTORY — DX: Menstrual migraine, not intractable, without status migrainosus: G43.829

## 2021-04-07 HISTORY — DX: Other constipation: K59.09

## 2021-04-07 HISTORY — DX: Personal history of other diseases of the respiratory system: Z87.09

## 2021-04-07 HISTORY — PX: IUD REMOVAL: SHX5392

## 2021-04-07 HISTORY — DX: Leiomyoma of uterus, unspecified: D25.9

## 2021-04-07 HISTORY — DX: Unspecified osteoarthritis, unspecified site: M19.90

## 2021-04-07 LAB — BASIC METABOLIC PANEL
Anion gap: 8 (ref 5–15)
BUN: 11 mg/dL (ref 6–20)
CO2: 26 mmol/L (ref 22–32)
Calcium: 9.2 mg/dL (ref 8.9–10.3)
Chloride: 104 mmol/L (ref 98–111)
Creatinine, Ser: 0.93 mg/dL (ref 0.44–1.00)
GFR, Estimated: >60 mL/min (ref >60)
Glucose, Bld: 80 mg/dL (ref 70–99)
Potassium: 5.8 mmol/L — ABNORMAL HIGH (ref 3.5–5.1)
Sodium: 138 mmol/L (ref 135–145)

## 2021-04-07 LAB — CBC
HCT: 44.3 % (ref 36.0–46.0)
Hemoglobin: 13.9 g/dL (ref 12.0–15.0)
MCH: 23.9 pg — ABNORMAL LOW (ref 26.0–34.0)
MCHC: 31.4 g/dL (ref 30.0–36.0)
MCV: 76.1 fL — ABNORMAL LOW (ref 80.0–100.0)
Platelets: 173 10*3/uL (ref 150–400)
RBC: 5.82 MIL/uL — ABNORMAL HIGH (ref 3.87–5.11)
RDW: 16.2 % — ABNORMAL HIGH (ref 11.5–15.5)
WBC: 4.1 10*3/uL (ref 4.0–10.5)
nRBC: 0 % (ref 0.0–0.2)

## 2021-04-07 LAB — POCT PREGNANCY, URINE: Preg Test, Ur: NEGATIVE

## 2021-04-07 SURGERY — SALPINGECTOMY, BILATERAL, LAPAROSCOPIC
Anesthesia: General | Site: Vagina | Laterality: Right

## 2021-04-07 SURGERY — SALPINGECTOMY, BILATERAL, LAPAROSCOPIC
Anesthesia: Choice

## 2021-04-07 MED ORDER — ONDANSETRON HCL 4 MG/2ML IJ SOLN
4.0000 mg | Freq: Once | INTRAMUSCULAR | Status: AC
  Administered 2021-04-07: 4 mg via INTRAVENOUS

## 2021-04-07 MED ORDER — LIDOCAINE HCL (PF) 2 % IJ SOLN
INTRAMUSCULAR | Status: AC
  Filled 2021-04-07: qty 5

## 2021-04-07 MED ORDER — ONDANSETRON HCL 4 MG/2ML IJ SOLN
INTRAMUSCULAR | Status: AC
  Filled 2021-04-07: qty 2

## 2021-04-07 MED ORDER — FENTANYL CITRATE (PF) 100 MCG/2ML IJ SOLN
INTRAMUSCULAR | Status: AC
  Filled 2021-04-07: qty 2

## 2021-04-07 MED ORDER — OXYCODONE HCL 5 MG/5ML PO SOLN: 5.0000 mg | Freq: Once | ORAL | Status: DC | PRN

## 2021-04-07 MED ORDER — ONDANSETRON HCL 4 MG/2ML IJ SOLN
INTRAMUSCULAR | Status: DC | PRN
  Administered 2021-04-07: 4 mg via INTRAVENOUS

## 2021-04-07 MED ORDER — OXYCODONE-ACETAMINOPHEN 5-325 MG PO TABS: ORAL_TABLET | ORAL | 0 refills | Status: AC

## 2021-04-07 MED ORDER — PROPOFOL 500 MG/50ML IV EMUL
INTRAVENOUS | Status: DC | PRN
  Administered 2021-04-07: 50 ug/kg/min via INTRAVENOUS

## 2021-04-07 MED ORDER — LACTATED RINGERS IV SOLN
INTRAVENOUS | Status: DC
  Administered 2021-04-07: 1000 mL via INTRAVENOUS

## 2021-04-07 MED ORDER — KETOROLAC TROMETHAMINE 30 MG/ML IJ SOLN
INTRAMUSCULAR | Status: AC
  Filled 2021-04-07: qty 1

## 2021-04-07 MED ORDER — MEPERIDINE HCL 25 MG/ML IJ SOLN: 6.2500 mg | INTRAMUSCULAR | Status: DC | PRN

## 2021-04-07 MED ORDER — IBUPROFEN 600 MG PO TABS: ORAL_TABLET | ORAL | 1 refills | Status: AC

## 2021-04-07 MED ORDER — MIDAZOLAM HCL 2 MG/2ML IJ SOLN
INTRAMUSCULAR | Status: AC
  Filled 2021-04-07: qty 2

## 2021-04-07 MED ORDER — MIDAZOLAM HCL 5 MG/5ML IJ SOLN
INTRAMUSCULAR | Status: DC | PRN
  Administered 2021-04-07: 2 mg via INTRAVENOUS

## 2021-04-07 MED ORDER — DEXAMETHASONE SODIUM PHOSPHATE 4 MG/ML IJ SOLN
INTRAMUSCULAR | Status: DC | PRN
  Administered 2021-04-07: 10 mg via INTRAVENOUS

## 2021-04-07 MED ORDER — AMISULPRIDE (ANTIEMETIC) 5 MG/2ML IV SOLN
10.0000 mg | Freq: Once | INTRAVENOUS | Status: AC | PRN
  Administered 2021-04-07: 10 mg via INTRAVENOUS

## 2021-04-07 MED ORDER — PROPOFOL 10 MG/ML IV BOLUS
INTRAVENOUS | Status: DC | PRN
  Administered 2021-04-07: 100 mg via INTRAVENOUS
  Administered 2021-04-07: 200 mg via INTRAVENOUS
  Administered 2021-04-07 (×2): 50 mg via INTRAVENOUS

## 2021-04-07 MED ORDER — AMISULPRIDE (ANTIEMETIC) 5 MG/2ML IV SOLN
INTRAVENOUS | Status: AC
  Filled 2021-04-07: qty 4

## 2021-04-07 MED ORDER — FENTANYL CITRATE (PF) 100 MCG/2ML IJ SOLN
INTRAMUSCULAR | Status: DC | PRN
  Administered 2021-04-07: 25 ug via INTRAVENOUS
  Administered 2021-04-07: 100 ug via INTRAVENOUS
  Administered 2021-04-07: 25 ug via INTRAVENOUS
  Administered 2021-04-07 (×2): 50 ug via INTRAVENOUS

## 2021-04-07 MED ORDER — ROCURONIUM BROMIDE 10 MG/ML (PF) SYRINGE
PREFILLED_SYRINGE | INTRAVENOUS | Status: AC
  Filled 2021-04-07: qty 10

## 2021-04-07 MED ORDER — HYDROMORPHONE HCL 1 MG/ML IJ SOLN: 0.2500 mg | INTRAMUSCULAR | Status: DC | PRN

## 2021-04-07 MED ORDER — ROCURONIUM BROMIDE 100 MG/10ML IV SOLN
INTRAVENOUS | Status: DC | PRN
  Administered 2021-04-07: 60 mg via INTRAVENOUS
  Administered 2021-04-07 (×2): 20 mg via INTRAVENOUS

## 2021-04-07 MED ORDER — SUGAMMADEX SODIUM 200 MG/2ML IV SOLN
INTRAVENOUS | Status: DC | PRN
  Administered 2021-04-07: 200 mg via INTRAVENOUS

## 2021-04-07 MED ORDER — KETOROLAC TROMETHAMINE 30 MG/ML IJ SOLN
INTRAMUSCULAR | Status: DC | PRN
  Administered 2021-04-07: 30 mg via INTRAVENOUS

## 2021-04-07 MED ORDER — BUPIVACAINE-EPINEPHRINE 0.25% -1:200000 IJ SOLN
INTRAMUSCULAR | Status: DC | PRN
  Administered 2021-04-07 (×2): 8 mL

## 2021-04-07 MED ORDER — PROPOFOL 10 MG/ML IV BOLUS
INTRAVENOUS | Status: AC
  Filled 2021-04-07: qty 20

## 2021-04-07 MED ORDER — LIDOCAINE HCL (CARDIAC) PF 100 MG/5ML IV SOSY
PREFILLED_SYRINGE | INTRAVENOUS | Status: DC | PRN
  Administered 2021-04-07 (×2): 50 mg via INTRAVENOUS
  Administered 2021-04-07: 100 mg via INTRAVENOUS

## 2021-04-07 MED ORDER — DEXAMETHASONE SODIUM PHOSPHATE 10 MG/ML IJ SOLN
INTRAMUSCULAR | Status: AC
  Filled 2021-04-07: qty 1

## 2021-04-07 MED ORDER — OXYCODONE HCL 5 MG PO TABS: 5.0000 mg | ORAL_TABLET | Freq: Once | ORAL | Status: DC | PRN

## 2021-04-07 SURGICAL SUPPLY — 47 items
ADH SKN CLS APL DERMABOND .7 (GAUZE/BANDAGES/DRESSINGS) ×2
APL SKNCLS STERI-STRIP NONHPOA (GAUZE/BANDAGES/DRESSINGS)
APL SRG 38 LTWT LNG FL B (MISCELLANEOUS)
APPLICATOR ARISTA FLEXITIP XL (MISCELLANEOUS) IMPLANT
BAG RETRIEVAL 10 (BASKET)
BAG RETRIEVAL 10MM (BASKET)
BENZOIN TINCTURE PRP APPL 2/3 (GAUZE/BANDAGES/DRESSINGS) IMPLANT
CABLE HIGH FREQUENCY MONO STRZ (ELECTRODE) IMPLANT
CLOSURE WOUND 1/2 X4 (GAUZE/BANDAGES/DRESSINGS)
COVER MAYO STAND STRL (DRAPES) ×4 IMPLANT
COVER WAND RF STERILE (DRAPES) ×4 IMPLANT
DECANTER SPIKE VIAL GLASS SM (MISCELLANEOUS) IMPLANT
DERMABOND ADVANCED (GAUZE/BANDAGES/DRESSINGS) ×2
DERMABOND ADVANCED .7 DNX12 (GAUZE/BANDAGES/DRESSINGS) ×2 IMPLANT
DRSG OPSITE POSTOP 3X4 (GAUZE/BANDAGES/DRESSINGS) ×4 IMPLANT
DURAPREP 26ML APPLICATOR (WOUND CARE) ×4 IMPLANT
ELECT REM PT RETURN 9FT ADLT (ELECTROSURGICAL) ×4
ELECTRODE REM PT RTRN 9FT ADLT (ELECTROSURGICAL) ×2 IMPLANT
GAUZE 4X4 16PLY RFD (DISPOSABLE) IMPLANT
GLOVE SURG POLYISO LF SZ6.5 (GLOVE) ×4 IMPLANT
GLOVE SURG UNDER POLY LF SZ7 (GLOVE) ×8 IMPLANT
GOWN STRL REUS W/TWL LRG LVL3 (GOWN DISPOSABLE) ×16 IMPLANT
HEMOSTAT ARISTA ABSORB 3G PWDR (HEMOSTASIS) IMPLANT
KIT TURNOVER CYSTO (KITS) ×4 IMPLANT
LIGASURE VESSEL 5MM BLUNT TIP (ELECTROSURGICAL) ×4 IMPLANT
NEEDLE HYPO 25X1 1.5 SAFETY (NEEDLE) IMPLANT
NEEDLE INSUFFLATION 120MM (ENDOMECHANICALS) IMPLANT
NS IRRIG 500ML POUR BTL (IV SOLUTION) ×4 IMPLANT
PACK LAPAROSCOPY BASIN (CUSTOM PROCEDURE TRAY) ×4 IMPLANT
PAD OB MATERNITY 4.3X12.25 (PERSONAL CARE ITEMS) ×4 IMPLANT
PAD PREP 24X48 CUFFED NSTRL (MISCELLANEOUS) ×4 IMPLANT
SCISSORS LAP 5X35 DISP (ENDOMECHANICALS) IMPLANT
SET SUCTION IRRIG HYDROSURG (IRRIGATION / IRRIGATOR) IMPLANT
SET TUBE SMOKE EVAC HIGH FLOW (TUBING) ×4 IMPLANT
SOLUTION ELECTROLUBE (MISCELLANEOUS) IMPLANT
STRIP CLOSURE SKIN 1/2X4 (GAUZE/BANDAGES/DRESSINGS) IMPLANT
SUT MNCRL AB 4-0 PS2 18 (SUTURE) ×8 IMPLANT
SUT PLAIN 3 0 FS 2 27 (SUTURE) ×4 IMPLANT
SUT VICRYL 0 UR6 27IN ABS (SUTURE) ×8 IMPLANT
SYS BAG RETRIEVAL 10MM (BASKET)
SYSTEM BAG RETRIEVAL 10MM (BASKET) IMPLANT
TOWEL OR 17X26 10 PK STRL BLUE (TOWEL DISPOSABLE) ×4 IMPLANT
TRAY FOLEY W/BAG SLVR 14FR LF (SET/KITS/TRAYS/PACK) ×4 IMPLANT
TROCAR BALLN 12MMX100 BLUNT (TROCAR) ×4 IMPLANT
TROCAR BLADELESS OPT 5 100 (ENDOMECHANICALS) ×8 IMPLANT
TROCAR XCEL NON-BLD 11X100MML (ENDOMECHANICALS) IMPLANT
WARMER LAPAROSCOPE (MISCELLANEOUS) ×4 IMPLANT

## 2021-04-07 NOTE — Op Note (Addendum)
Patient: Hayley Chambers DOB: Mar 07, 1986 MRN: [id number] Date of procedure: 04/07/2021.     PREOP DIAGNOSIS: 1.Parous patient  (Para 2) desiring permanent sterilization and also desiring to decrease her future risk of fallopian tube and ovarian cancer 2. History of laparoscopic left salpingectomy for ectopic pregnancy. 3. Desires IUD removal. 4. BMI 43.   POSTOP DIAGNOSIS: Same as above   PROCEDURES: 1. Laparoscopic right salpingectomy 2. Mirena IUD removal.    SURGEON: Dr. Waymon Amato   ASSISTANT: Earnstine Regal, PA  I was present and scrubbed and the assistant was required due to the complexity of anatomy.   ATTENDING SURGEON ATTESTATION: I was present and scrubbed for the procedure and the assistant was required due to the complexity of anatomy.    ANESTHESIA: General ETA.    COMPLICATIONS: None   EBL: 20 mL   IV FLUID: 1600 mL LR   URINE OUTPUT: 200 mL   FINDINGS: Normal uterus with small anterior subserosal fibroid. Normal right fallopian tube.  Small nubbin of proximal left fallopian tube from the uterus cornual end.  Normal left and right ovaries.  Normal appendix. Normal liver edge. Normal ureters bilaterally on pelvic side wall.     PROCEDURE:    Informed consent was obtained from the patient to undergo the procedures after discussing the risks benefits and alternatives of the procedure. She was taken to the operating room where anesthesia was administered without difficulty. Both arms were tucked and she was placed in the dorsal lithotomy position. She was prepped abdominally, vaginally and perineum in the usual sterile fashion. Foley catheter was placed in the bladder and a Hulka uterine manipulator was placed in.     Attention was then turned to the abdomen where 0.25% marcaine was instilled in the infraumbilical area. The skin was incised with a scalpel and and open entry into the abdomen was made through the subcutaneous fascia and peritoneal layers. The fascia was  tied with 0 Vicryl at the edges.  The Hasson trocar was placed in and 20 mL air instilled into the bulb.  The abdomen was insufflated with gas and pressure maintained at 16mm Hg.   The scope was then placed in and the pelvis was visualized. Patient was placed in trendelenburg position.  Two 5 mm ports were placed the using the Excel trocar on the left and right lower abdomens about 10 cm lateral from the midline and just below umbilicus level under direct visualization. The right fallopian tube was then removed using the Ligasure were. It was then placed in the anterior cul de sac. The left fallopian tube nubbin was noted to be small and further removal of it would cause bleeding from the uterus cornual edge therefore it was left intact.  The specimen were then easily removed through the 71mm port.  The pelvis was inspected and it was noted to be hemostatic as well as a general survey of the pelvis revealed the above findings.  She received 5 manual lung inflations from anesthesia and gas was turned off and allowed to escape.  All ports were then removed under direct visualization.  Umbilical incision was closed using 0 Vicryl  Suture.  The subcutaneous later was closed off with 2-0 plain suture.  The skin incisions were closed with 4-0 Monocryl.  Dermabond and and honeycomb dressing were placed. Attention was then turned to the perineum where Hulka tenaculum was removed. The IUD strings were then grasped with a ring foley and gently pulled out.  The foley catheter  was then removed.  The patient was then awoken from anesthesia and she was taken to recovery room in stable condition.  Toradol IV was given before leaving the OR.    SPECIMENS:  Right fallopian tube to pathology. Mirena IUD for disposal.    Rashad Obeid, MD.  04/07/21. 1556.

## 2021-04-07 NOTE — Anesthesia Preprocedure Evaluation (Addendum)
Anesthesia Evaluation  Patient identified by MRN, date of birth, ID band Patient awake    Reviewed: Allergy & Precautions, NPO status , Patient's Chart, lab work & pertinent test results  History of Anesthesia Complications (+) PONV and history of anesthetic complications  Airway Mallampati: II  TM Distance: >3 FB Neck ROM: Full    Dental  (+) Dental Advisory Given, Teeth Intact   Pulmonary neg pulmonary ROS,    Pulmonary exam normal breath sounds clear to auscultation       Cardiovascular negative cardio ROS Normal cardiovascular exam Rhythm:Regular Rate:Normal     Neuro/Psych  Headaches,    GI/Hepatic negative GI ROS, Neg liver ROS,   Endo/Other  negative endocrine ROS  Renal/GU negative Renal ROS     Musculoskeletal  (+) Arthritis ,   Abdominal (+) + obese,   Peds  Hematology negative hematology ROS (+) anemia ,   Anesthesia Other Findings   Reproductive/Obstetrics                          Anesthesia Physical Anesthesia Plan  ASA: 3  Anesthesia Plan: General   Post-op Pain Management:    Induction: Intravenous  PONV Risk Score and Plan: 4 or greater and Ondansetron, Dexamethasone, Treatment may vary due to age or medical condition, Scopolamine patch - Pre-op, Diphenhydramine and Midazolam  Airway Management Planned: Oral ETT  Additional Equipment: None  Intra-op Plan:   Post-operative Plan: Extubation in OR  Informed Consent: I have reviewed the patients History and Physical, chart, labs and discussed the procedure including the risks, benefits and alternatives for the proposed anesthesia with the patient or authorized representative who has indicated his/her understanding and acceptance.     Dental advisory given  Plan Discussed with: CRNA  Anesthesia Plan Comments:        Anesthesia Quick Evaluation

## 2021-04-07 NOTE — Interval H&P Note (Signed)
History and Physical Interval Note:  04/07/2021 1:07 PM  Hayley Chambers  has presented today for surgery, with the diagnosis of desires sterilization IUD removal.  The various methods of treatment have been discussed with the patient and family. After consideration of risks, benefits and other options for treatment, the patient has consented to  Procedure(s): LAPAROSCOPIC BILATERAL SALPINGECTOMY (Bilateral) INTRAUTERINE DEVICE (IUD) REMOVAL (N/A) as a surgical intervention.  The patient's history has been reviewed, patient examined, no change in status, stable for surgery.  I have reviewed the patient's chart and labs.  Questions were answered to the patient's satisfaction.   Archie Endo, MD.

## 2021-04-07 NOTE — Anesthesia Procedure Notes (Signed)
Procedure Name: Intubation Date/Time: 04/07/2021 1:23 PM Performed by: Justice Rocher, CRNA Pre-anesthesia Checklist: Patient identified, Emergency Drugs available, Suction available, Patient being monitored and Timeout performed Patient Re-evaluated:Patient Re-evaluated prior to induction Oxygen Delivery Method: Circle system utilized Preoxygenation: Pre-oxygenation with 100% oxygen Induction Type: IV induction Ventilation: Mask ventilation without difficulty Laryngoscope Size: Mac and 3 Grade View: Grade II Tube type: Oral Tube size: 7.0 mm Number of attempts: 1 Airway Equipment and Method: Stylet and Oral airway Placement Confirmation: ETT inserted through vocal cords under direct vision, positive ETCO2, breath sounds checked- equal and bilateral and CO2 detector Secured at: 22 cm Tube secured with: Tape Dental Injury: Teeth and Oropharynx as per pre-operative assessment

## 2021-04-07 NOTE — Transfer of Care (Signed)
Immediate Anesthesia Transfer of Care Note  Patient: Hayley Chambers  Procedure(s) Performed: Procedure(s) (LRB): LAPAROSCOPIC RIGHT SALPINGECTOMY (Right) INTRAUTERINE DEVICE (IUD) REMOVAL (N/A)  Patient Location: PACU  Anesthesia Type: General  Level of Consciousness: awake, sedated, patient cooperative and responds to stimulation  Airway & Oxygen Therapy: Patient Spontanous Breathing and Patient connected to NC02 and soft FM  Post-op Assessment: Report given to PACU RN, Post -op Vital signs reviewed and stable and Patient moving all extremities  Post vital signs: Reviewed and stable  Complications: No apparent anesthesia complications

## 2021-04-07 NOTE — OR Nursing (Signed)
Removed IUD at 1458 by Dr. Alesia Richards

## 2021-04-07 NOTE — Discharge Instructions (Addendum)
Drisana,  - You may remove the honeycomb dressing in one week (next week on Friday) but it may fall out by itself sooner than that and that's okay.  The other two side incisions have dermabond glue.  You may shower as usual, keeping water exposure over the abdomen to a minimal and patting the incisions dry after showering.  To remove the dressing in 1 week, soak it wet with water during showering and then gently peel them away starting at the corners.     -Do not have any intercourse after the procedure until cleared.  Expect some vaginal bleeding, do not use tampons or douching for the next two weeks.    -You may have some upper shoulder pain from the gas that was used during the procedure.  This should resolved over time as you ambulate more.  -You may have some bruising over your abdomen, it may look dark blue and then over time start to lighten up as it resolves.  You may use a heat pad over the abdomen if with significant abdominal pain or bruising.Let me know if the bruising or pain does not improve over time.   Dr. Alesia Richards.  [phone] [address].   Call [facility] @ [phone] if:  You have a temperature greater than or equal to 100.4 degrees Farenheit orally You have pain that is not made better by the pain medication given and taken as directed You have excessive bleeding or problems urinating  Take Colace (Docusate Sodium/Stool Softener) 100 mg 2-3 times daily while taking narcotic pain medicine to avoid constipation or until bowel movements are regular. Take, with food,  Ibuprofen 600 mg every 6 hours (first dose today at 9 p.m.) for 3 days then as needed for pain  You may drive after  24 hours You may walk up steps  You may shower tomorrow You may resume a regular diet  Keep incisions clean and dry Do not lift over 15 pounds for 2 weeks Avoid anything in vagina until after your post-operative visit   Post Anesthesia Home Care  Instructions  Activity: Get plenty of rest for the remainder of the day. A responsible individual must stay with you for 24 hours following the procedure.  For the next 24 hours, DO NOT: -Drive a car -Paediatric nurse -Drink alcoholic beverages -Take any medication unless instructed by your physician -Make any legal decisions or sign important papers.  Meals: Start with liquid foods such as gelatin or soup. Progress to regular foods as tolerated. Avoid greasy, spicy, heavy foods. If nausea and/or vomiting occur, drink only clear liquids until the nausea and/or vomiting subsides. Call your physician if vomiting continues.  Special Instructions/Symptoms: Your throat may feel dry or sore from the anesthesia or the breathing tube placed in your throat during surgery. If this causes discomfort, gargle with warm salt water. The discomfort should disappear within 24 hours.

## 2021-04-10 ENCOUNTER — Encounter (HOSPITAL_BASED_OUTPATIENT_CLINIC_OR_DEPARTMENT_OTHER): Payer: Self-pay | Admitting: Obstetrics & Gynecology

## 2021-04-10 LAB — SURGICAL PATHOLOGY

## 2021-04-10 NOTE — Anesthesia Postprocedure Evaluation (Signed)
Anesthesia Post Note  Patient: Hayley Chambers  Procedure(s) Performed: LAPAROSCOPIC RIGHT SALPINGECTOMY (Right: Abdomen) INTRAUTERINE DEVICE (IUD) REMOVAL (Vagina )     Patient location during evaluation: PACU Anesthesia Type: General Level of consciousness: awake and alert and oriented Pain management: pain level controlled Vital Signs Assessment: post-procedure vital signs reviewed and stable Respiratory status: spontaneous breathing, nonlabored ventilation and respiratory function stable Cardiovascular status: blood pressure returned to baseline Postop Assessment: no apparent nausea or vomiting Anesthetic complications: no   No notable events documented.  Last Vitals:  Vitals:   04/07/21 1615 04/07/21 1707  BP: 115/71 124/77  Pulse: 96 98  Resp: 15 16  Temp:  (!) 36.2 C  SpO2: 94% 97%    Last Pain:  Vitals:   04/07/21 1707  TempSrc:   PainSc: 0-No pain   Pain Goal: Patients Stated Pain Goal: 6 (04/07/21 1100)                 [name]
